# Patient Record
Sex: Female | Born: 1980 | Race: White | Hispanic: Yes | Marital: Married | Smoking: Never smoker
Health system: Southern US, Community
[De-identification: ages and names within clinical notes are randomized; demographics above are authoritative.]

## PROBLEM LIST (undated history)

## (undated) DIAGNOSIS — O139 Gestational [pregnancy-induced] hypertension without significant proteinuria, unspecified trimester: Secondary | ICD-10-CM

## (undated) DIAGNOSIS — R896 Abnormal cytological findings in specimens from other organs, systems and tissues: Secondary | ICD-10-CM

## (undated) DIAGNOSIS — Z975 Presence of (intrauterine) contraceptive device: Secondary | ICD-10-CM

## (undated) DIAGNOSIS — R87629 Unspecified abnormal cytological findings in specimens from vagina: Secondary | ICD-10-CM

## (undated) DIAGNOSIS — N83201 Unspecified ovarian cyst, right side: Secondary | ICD-10-CM

## (undated) DIAGNOSIS — F419 Anxiety disorder, unspecified: Secondary | ICD-10-CM

## (undated) DIAGNOSIS — IMO0001 Reserved for inherently not codable concepts without codable children: Secondary | ICD-10-CM

## (undated) DIAGNOSIS — D069 Carcinoma in situ of cervix, unspecified: Secondary | ICD-10-CM

## (undated) HISTORY — DX: Anxiety disorder, unspecified: F41.9

## (undated) HISTORY — DX: Reserved for inherently not codable concepts without codable children: IMO0001

## (undated) HISTORY — DX: Presence of (intrauterine) contraceptive device: Z97.5

## (undated) HISTORY — DX: Unspecified ovarian cyst, right side: N83.201

## (undated) HISTORY — DX: Abnormal cytological findings in specimens from other organs, systems and tissues: R89.6

## (undated) HISTORY — DX: Unspecified abnormal cytological findings in specimens from vagina: R87.629

## (undated) HISTORY — DX: Gestational (pregnancy-induced) hypertension without significant proteinuria, unspecified trimester: O13.9

## (undated) HISTORY — DX: Carcinoma in situ of cervix, unspecified: D06.9

---

## 2005-10-04 ENCOUNTER — Ambulatory Visit: Payer: Self-pay | Admitting: Family Medicine

## 2005-10-05 ENCOUNTER — Ambulatory Visit: Payer: Self-pay | Admitting: Family Medicine

## 2005-11-19 ENCOUNTER — Ambulatory Visit: Payer: Self-pay | Admitting: *Deleted

## 2005-11-19 ENCOUNTER — Ambulatory Visit: Payer: Self-pay | Admitting: Family Medicine

## 2005-12-27 ENCOUNTER — Ambulatory Visit: Payer: Self-pay | Admitting: Internal Medicine

## 2006-01-24 ENCOUNTER — Ambulatory Visit: Payer: Self-pay | Admitting: Family Medicine

## 2006-03-18 ENCOUNTER — Ambulatory Visit: Payer: Self-pay | Admitting: Family Medicine

## 2006-06-09 ENCOUNTER — Ambulatory Visit: Payer: Self-pay | Admitting: Internal Medicine

## 2006-08-15 ENCOUNTER — Ambulatory Visit: Payer: Self-pay | Admitting: Family Medicine

## 2006-09-02 ENCOUNTER — Ambulatory Visit: Payer: Self-pay | Admitting: Family Medicine

## 2006-11-24 ENCOUNTER — Ambulatory Visit: Payer: Self-pay | Admitting: Family Medicine

## 2007-01-26 ENCOUNTER — Ambulatory Visit: Payer: Self-pay | Admitting: Internal Medicine

## 2007-01-27 ENCOUNTER — Encounter: Payer: Self-pay | Admitting: Internal Medicine

## 2007-01-27 LAB — CONVERTED CEMR LAB
BUN: 7 mg/dL (ref 6–23)
CO2: 23 meq/L (ref 19–32)
Creatinine, Ser: 0.61 mg/dL (ref 0.40–1.20)
Glucose, Bld: 70 mg/dL (ref 70–99)
Helicobacter Pylori Antibody-IgG: 1 — ABNORMAL HIGH
Total Bilirubin: 0.4 mg/dL (ref 0.3–1.2)

## 2007-02-15 ENCOUNTER — Ambulatory Visit: Payer: Self-pay | Admitting: Internal Medicine

## 2007-03-08 ENCOUNTER — Encounter (INDEPENDENT_AMBULATORY_CARE_PROVIDER_SITE_OTHER): Payer: Self-pay | Admitting: *Deleted

## 2007-05-09 ENCOUNTER — Ambulatory Visit: Payer: Self-pay | Admitting: Internal Medicine

## 2007-06-08 ENCOUNTER — Ambulatory Visit: Payer: Self-pay | Admitting: Internal Medicine

## 2007-06-08 ENCOUNTER — Encounter: Payer: Self-pay | Admitting: Internal Medicine

## 2007-06-08 LAB — CONVERTED CEMR LAB
Chlamydia, DNA Probe: NEGATIVE
GC Probe Amp, Genital: NEGATIVE

## 2007-07-31 ENCOUNTER — Ambulatory Visit: Payer: Self-pay | Admitting: Family Medicine

## 2007-10-24 ENCOUNTER — Ambulatory Visit: Payer: Self-pay | Admitting: Internal Medicine

## 2007-11-14 ENCOUNTER — Ambulatory Visit: Payer: Self-pay | Admitting: Internal Medicine

## 2008-01-15 ENCOUNTER — Ambulatory Visit: Payer: Self-pay | Admitting: Internal Medicine

## 2008-09-23 ENCOUNTER — Ambulatory Visit: Payer: Self-pay | Admitting: Gynecology

## 2008-09-23 ENCOUNTER — Encounter: Payer: Self-pay | Admitting: Gynecology

## 2008-09-23 ENCOUNTER — Other Ambulatory Visit: Admission: RE | Admit: 2008-09-23 | Discharge: 2008-09-23 | Payer: Self-pay | Admitting: Gynecology

## 2008-10-07 ENCOUNTER — Ambulatory Visit: Payer: Self-pay | Admitting: Gynecology

## 2008-11-04 ENCOUNTER — Encounter: Payer: Self-pay | Admitting: Gynecology

## 2008-11-04 ENCOUNTER — Ambulatory Visit: Payer: Self-pay | Admitting: Gynecology

## 2009-02-23 ENCOUNTER — Emergency Department (HOSPITAL_COMMUNITY): Admission: EM | Admit: 2009-02-23 | Discharge: 2009-02-23 | Payer: Self-pay | Admitting: Emergency Medicine

## 2009-02-27 ENCOUNTER — Ambulatory Visit: Payer: Self-pay | Admitting: Gynecology

## 2009-05-02 ENCOUNTER — Other Ambulatory Visit: Admission: RE | Admit: 2009-05-02 | Discharge: 2009-05-02 | Payer: Self-pay | Admitting: Gynecology

## 2009-05-02 ENCOUNTER — Ambulatory Visit: Payer: Self-pay | Admitting: Gynecology

## 2009-05-02 ENCOUNTER — Encounter: Payer: Self-pay | Admitting: Gynecology

## 2009-09-26 ENCOUNTER — Ambulatory Visit: Payer: Self-pay | Admitting: Gynecology

## 2009-09-26 ENCOUNTER — Other Ambulatory Visit: Admission: RE | Admit: 2009-09-26 | Discharge: 2009-09-26 | Payer: Self-pay | Admitting: Gynecology

## 2009-11-27 ENCOUNTER — Ambulatory Visit: Payer: Self-pay | Admitting: Gynecology

## 2010-09-25 LAB — URINALYSIS, ROUTINE W REFLEX MICROSCOPIC
Hgb urine dipstick: NEGATIVE
Nitrite: NEGATIVE
Protein, ur: NEGATIVE mg/dL
Urobilinogen, UA: 0.2 mg/dL (ref 0.0–1.0)

## 2010-09-25 LAB — CBC
Hemoglobin: 13.7 g/dL (ref 12.0–15.0)
MCHC: 34 g/dL (ref 30.0–36.0)
MCV: 91.6 fL (ref 78.0–100.0)
RBC: 4.4 MIL/uL (ref 3.87–5.11)

## 2010-09-25 LAB — URINE MICROSCOPIC-ADD ON

## 2010-09-25 LAB — BASIC METABOLIC PANEL
CO2: 28 mEq/L (ref 19–32)
Chloride: 104 mEq/L (ref 96–112)
Creatinine, Ser: 0.43 mg/dL (ref 0.4–1.2)
GFR calc Af Amer: >60 mL/min (ref >60)

## 2010-09-25 LAB — DIFFERENTIAL
Eosinophils Absolute: 0 10*3/uL (ref 0.0–0.7)
Eosinophils Relative: 1 % (ref 0–5)
Lymphs Abs: 2.1 10*3/uL (ref 0.7–4.0)
Monocytes Relative: 5 % (ref 3–12)

## 2010-10-02 ENCOUNTER — Encounter (INDEPENDENT_AMBULATORY_CARE_PROVIDER_SITE_OTHER): Payer: 59 | Admitting: Gynecology

## 2010-10-02 ENCOUNTER — Other Ambulatory Visit: Payer: Self-pay | Admitting: Gynecology

## 2010-10-02 ENCOUNTER — Other Ambulatory Visit (HOSPITAL_COMMUNITY)
Admission: RE | Admit: 2010-10-02 | Discharge: 2010-10-02 | Disposition: A | Discharge status: Home / Self Care | Payer: 59 | Source: Ambulatory Visit | Attending: Gynecology | Admitting: Gynecology

## 2010-10-02 DIAGNOSIS — Z01419 Encounter for gynecological examination (general) (routine) without abnormal findings: Secondary | ICD-10-CM

## 2010-10-02 DIAGNOSIS — R82998 Other abnormal findings in urine: Secondary | ICD-10-CM

## 2010-10-02 DIAGNOSIS — Z124 Encounter for screening for malignant neoplasm of cervix: Secondary | ICD-10-CM | POA: Insufficient documentation

## 2010-10-02 DIAGNOSIS — N760 Acute vaginitis: Secondary | ICD-10-CM

## 2010-10-02 DIAGNOSIS — Z1159 Encounter for screening for other viral diseases: Secondary | ICD-10-CM | POA: Insufficient documentation

## 2010-10-02 DIAGNOSIS — Z13220 Encounter for screening for lipoid disorders: Secondary | ICD-10-CM

## 2010-10-02 DIAGNOSIS — Z3049 Encounter for surveillance of other contraceptives: Secondary | ICD-10-CM

## 2010-10-02 DIAGNOSIS — R635 Abnormal weight gain: Secondary | ICD-10-CM

## 2010-10-02 DIAGNOSIS — B373 Candidiasis of vulva and vagina: Secondary | ICD-10-CM

## 2010-10-02 DIAGNOSIS — N898 Other specified noninflammatory disorders of vagina: Secondary | ICD-10-CM

## 2010-10-02 DIAGNOSIS — Z833 Family history of diabetes mellitus: Secondary | ICD-10-CM

## 2011-01-08 ENCOUNTER — Ambulatory Visit: Payer: 59 | Admitting: Gynecology

## 2011-01-08 ENCOUNTER — Ambulatory Visit (INDEPENDENT_AMBULATORY_CARE_PROVIDER_SITE_OTHER): Payer: 59 | Admitting: Gynecology

## 2011-01-08 DIAGNOSIS — R51 Headache: Secondary | ICD-10-CM

## 2011-01-08 DIAGNOSIS — R635 Abnormal weight gain: Secondary | ICD-10-CM

## 2011-01-08 DIAGNOSIS — N949 Unspecified condition associated with female genital organs and menstrual cycle: Secondary | ICD-10-CM

## 2011-01-08 DIAGNOSIS — N915 Oligomenorrhea, unspecified: Secondary | ICD-10-CM

## 2011-01-19 ENCOUNTER — Encounter: Payer: Self-pay | Admitting: Gynecology

## 2011-01-19 ENCOUNTER — Ambulatory Visit (INDEPENDENT_AMBULATORY_CARE_PROVIDER_SITE_OTHER): Payer: 59 | Admitting: Gynecology

## 2011-01-19 VITALS — BP 128/84

## 2011-01-19 DIAGNOSIS — Z30431 Encounter for routine checking of intrauterine contraceptive device: Secondary | ICD-10-CM

## 2011-01-19 DIAGNOSIS — Z975 Presence of (intrauterine) contraceptive device: Secondary | ICD-10-CM

## 2011-01-19 HISTORY — DX: Presence of (intrauterine) contraceptive device: Z97.5

## 2011-01-19 MED ORDER — MEDROXYPROGESTERONE ACETATE 10 MG PO TABS: 10.0000 mg | ORAL_TABLET | Freq: Every day | ORAL | Status: DC

## 2011-01-19 NOTE — Progress Notes (Signed)
Patient is a 30 year old gravida 1 para 1 who seen in the office on July 20 she complaining of headaches. She had a Mirena IUD placed in 2010 it had occasional spotting between her periods. She has been under a lot of tension are working for her headaches may be attributed to that or a combination of the progesterone component of the Mirena IUD. Should be given Xanax 0.25 mg to take daily on a when necessary basis. And also she had been given prescription for treximet which helped her symptoms. She presented to the office today to remove her Mirena IUD and to replace it with a nonhormonal IUD such as the ParaGard T380A IUD.  Pelvic exam: Bartholin urethra Skene glands: Within normal limits Vagina: No gross lesions on inspection Cervix: IUD string visualized Bimanual exam: Uterus anteverted upper limits of normal no palpable masses or tenderness Adnexa: No palpable masses or tenderness  The patient was counseled as to the risks benefits and pros and cons of the ParaGard IUD she fully accepts and understands information was provided in [place]. The cervix was cleansed with Betadine solution the Mirena IUD was removed shown to the patient and discarded. A single-tooth tenaculum was then placed on the anterior cervical lip and the uterus was sounded to proximally 7 cm. The ParaGard T380A IUD was placed in a sterile fashion. Patient is fully aware that this for contraception is 99% effective and is good for 10 years. She'll return back in one month for followup as well as she needs a followup Pap smear due to the fact that time the pharyngeal exam on April of 2012 her Pap smear had demonstrated atypical squamous cells of undetermined significance acute inflammation and high-risk HPV was not detected. All questions were answered.

## 2011-01-19 NOTE — Patient Instructions (Signed)
Tomar provera 10 mg por 5-10 dias al mes si no te comienza el periodo espontanio.

## 2011-01-27 ENCOUNTER — Ambulatory Visit: Payer: 59 | Admitting: Gynecology

## 2011-02-19 ENCOUNTER — Ambulatory Visit (INDEPENDENT_AMBULATORY_CARE_PROVIDER_SITE_OTHER): Payer: 59 | Admitting: Gynecology

## 2011-02-19 ENCOUNTER — Ambulatory Visit: Payer: 59 | Admitting: Gynecology

## 2011-02-19 DIAGNOSIS — B373 Candidiasis of vulva and vagina: Secondary | ICD-10-CM

## 2011-02-19 DIAGNOSIS — Z30431 Encounter for routine checking of intrauterine contraceptive device: Secondary | ICD-10-CM

## 2011-02-19 DIAGNOSIS — N898 Other specified noninflammatory disorders of vagina: Secondary | ICD-10-CM

## 2011-02-19 DIAGNOSIS — Z113 Encounter for screening for infections with a predominantly sexual mode of transmission: Secondary | ICD-10-CM

## 2011-02-19 DIAGNOSIS — IMO0001 Reserved for inherently not codable concepts without codable children: Secondary | ICD-10-CM | POA: Insufficient documentation

## 2011-02-19 MED ORDER — FLUCONAZOLE 150 MG PO TABS: 150.0000 mg | ORAL_TABLET | Freq: Once | ORAL | Status: AC

## 2011-02-19 NOTE — Progress Notes (Signed)
A 30 year old gravida 1 para 1 who presented to the office today after one month of placement of a ParaGard T380A IUD. She was requesting the IUD string to be trimmed. She was interested in a full STD screen.  Pelvic exam: Bartholin urethra Skene glands: Within normal limits Vagina: No vaginal discharge or lesions Cervix: IUD string visualized no lesion discharge Uterus: Anteverted normal size shape and consistency Adnexa: No palpable masses or tenderness Rectal exam: Not done  GC and Chlamydia culture along with a wet prep was done today. Patient will stop by the lab to complete the STD screen she will have an HIV, hepatitis B surface antigen, and RPR. Will notify her next week and is any abnormality of a these tests otherwise will see her back in one year or when necessary. Of note the wet prep demonstrated moniliasis and she was given a prescription of Diflucan 150 mg to take one by mouth.

## 2011-06-18 ENCOUNTER — Ambulatory Visit (INDEPENDENT_AMBULATORY_CARE_PROVIDER_SITE_OTHER): Payer: 59 | Admitting: Women's Health

## 2011-06-18 ENCOUNTER — Encounter: Payer: Self-pay | Admitting: Women's Health

## 2011-06-18 DIAGNOSIS — R1031 Right lower quadrant pain: Secondary | ICD-10-CM

## 2011-06-18 DIAGNOSIS — R82998 Other abnormal findings in urine: Secondary | ICD-10-CM

## 2011-06-18 DIAGNOSIS — N898 Other specified noninflammatory disorders of vagina: Secondary | ICD-10-CM

## 2011-06-18 DIAGNOSIS — R1032 Left lower quadrant pain: Secondary | ICD-10-CM

## 2011-06-18 DIAGNOSIS — R109 Unspecified abdominal pain: Secondary | ICD-10-CM

## 2011-06-18 MED ORDER — METRONIDAZOLE 0.75 % VA GEL: VAGINAL | Status: AC

## 2011-06-18 MED ORDER — FLUCONAZOLE 150 MG PO TABS: 150.0000 mg | ORAL_TABLET | Freq: Once | ORAL | Status: AC

## 2011-06-18 NOTE — Progress Notes (Signed)
Patient ID: Stacy Romero, female   DOB: 02-03-1981, 30 y.o.   MRN: [id number] Presents with a complaint of vaginal discharge, slight abdominal pain, 2 cycles in November, LMP December 15. Rare cycles with a Mirena IUD placed in 2010. Denies urinary symptoms, fever. Same partner, questions fidelity. Negative STD screen August of 2012.  Exam: No CVAT, abdomen soft, nontender, no rebound or radiation of pain. External genitalia is within normal limits, speculum exam scant white discharge, IUD string visible at os. Wet prep positive for yeast amines, clue cells and +3 bacteria. GC Chlamydia culture pending bimanual no CMT or adnexal fullness or tenderness. UA 5-10 WBCs and +1 bacteria.  BV and yeast  Plan: MetroGel vaginal cream 1 applicator at bedtime x5, and Diflucan 150 by mouth x1 dose with refill. If GC/ Chlamydia culture positive will return to the office for HIV hepatitis and RPR. Instructed to keep a menstrual record, if cycles closer than every 21 days instructed to return to office. Condoms encouraged until permanent partner. Urine culture pending.

## 2011-06-21 LAB — GC/CHLAMYDIA PROBE AMP, GENITAL
Chlamydia, DNA Probe: NEGATIVE
GC Probe Amp, Genital: NEGATIVE

## 2011-07-28 ENCOUNTER — Encounter: Payer: Self-pay | Admitting: Gynecology

## 2011-07-28 ENCOUNTER — Ambulatory Visit (INDEPENDENT_AMBULATORY_CARE_PROVIDER_SITE_OTHER): Payer: 59 | Admitting: Gynecology

## 2011-07-28 VITALS — BP 130/70

## 2011-07-28 DIAGNOSIS — R635 Abnormal weight gain: Secondary | ICD-10-CM | POA: Insufficient documentation

## 2011-07-28 DIAGNOSIS — R5383 Other fatigue: Secondary | ICD-10-CM

## 2011-07-28 DIAGNOSIS — H538 Other visual disturbances: Secondary | ICD-10-CM

## 2011-07-28 DIAGNOSIS — R51 Headache: Secondary | ICD-10-CM

## 2011-07-28 LAB — CBC WITH DIFFERENTIAL/PLATELET
Basophils Relative: 0 % (ref 0–1)
Hemoglobin: 12.5 g/dL (ref 12.0–15.0)
Lymphs Abs: 2.6 10*3/uL (ref 0.7–4.0)
MCHC: 33.2 g/dL (ref 30.0–36.0)
Monocytes Relative: 5 % (ref 3–12)
Neutro Abs: 4.9 10*3/uL (ref 1.7–7.7)
Neutrophils Relative %: 61 % (ref 43–77)
Platelets: 273 10*3/uL (ref 150–400)
RBC: 4.19 MIL/uL (ref 3.87–5.11)

## 2011-07-28 LAB — COMPREHENSIVE METABOLIC PANEL
ALT: 10 U/L (ref 0–35)
AST: 14 U/L (ref 0–37)
CO2: 27 mEq/L (ref 19–32)
Sodium: 140 mEq/L (ref 135–145)
Total Bilirubin: 0.2 mg/dL — ABNORMAL LOW (ref 0.3–1.2)
Total Protein: 7.2 g/dL (ref 6.0–8.3)

## 2011-07-28 MED ORDER — SUMATRIPTAN-NAPROXEN SODIUM 85-500 MG PO TABS: 1.0000 | ORAL_TABLET | ORAL | Status: DC | PRN

## 2011-07-28 MED ORDER — ALPRAZOLAM 0.5 MG PO TABS: 0.5000 mg | ORAL_TABLET | Freq: Every evening | ORAL | Status: DC | PRN

## 2011-07-28 NOTE — Progress Notes (Signed)
Patient is a 31 year old who presented to the office today for almost a week and a half of constant headache. That has been worse. She stated she's had this headache since last year. She had a Mirena IUD and a few months ago we switched it to a ParaGard T380A IUD (nonhormonal) to see if this would help with her headaches and she states that she has not noticed any difference. Her headaches are described being temporal in nature the started the beginning of the day and migrate to the back of her head and neck. She states that she's in front of a computer for 12 hours a day and sometimes worse as much as 60 hours per week.  She had seen an ophthalmologist and he recommended contact lenses to help with some of the glare from the computer screen but patient used to for 3 days and could not tolerate the contact lenses. She states that when she is at home and at work her headache decreased in frequency and intensity. She states at times she does have double vision but denies any nipple discharge. Her cycles are otherwise reported to be regular.  She's been complaining of dryness fatigue and weight gain. Review of her record indicated in August 2012 she had an HIV, RPR, hepatitis B. screen which were all normal.  Exam: HEENT: Unremarkable Lungs: Clear to auscultation Rogers or wheezes Heart: Regular rate and rhythm no murmurs or gallops  Patient was given a sample and a prescription for sumatriptan to take 1 tablet when her headache starts in may repeat in 2 hours if needed but not to exceed 2 tablets in a 24-hour period. She will stop by the lab where going to check her TSH, prolactin, comprehensive metabolic panel and CBC. We'll also schedule for her to have an MRI and then to followup with her neurologist. She was asked to keep a diary of her headaches to see if she can identify any foods that may be triggering at as well. Literature information on headaches in Spanish was provided.

## 2011-07-28 NOTE — Patient Instructions (Signed)
Te vamos a llamar esta semana para las cita: MRI y cita con neurologo.   Dolor de Training and development officer, preguntas frecuentes y sus respuestas (Headaches, Frequently Asked Questions) CEFALEAS MIGRAOSAS P: Qu es la migraa? Qu la ocasiona? Cmo puedo tratarla? R: En general, la migraa comienza como un dolor apagado. Luego progresa hacia un dolor, constante, punzante y como un latido. Sentir Scientist, physiological las sienes. Podr sentir Radiographer, therapeutic parte anterior o posterior de la cabeza, o en uno o ambos lados. El dolor suele estar acompaado de una combinacin de:  Nuseas.   Vmitos.   Sensibilidad a la luz y los ruidos.  Algunas personas (un 15%) experimentan un aura (ver abajo) antes de un ataque. La causa de la migraa se debe a reacciones qumicas del cerebro. El tratamiento para la migraa puede incluir medicamentos de [place]. Tambin puede incluir tcnicas de [place]. Estas incluyen entrenamientos para la relajacin y biorretroalimentacin.  P: Qu es un aura? R: Alrededor del 15% de las personas con migraa tiene un "aura". Es una seal de sntomas neurolgicos que ocurren antes de un dolor de cabeza por migraas. Podr ver lneas onduladas o irregulares, puntos o luces parpadeantes. Podr experimentar visin de tnel o puntos ciegos en uno o ambos ojos. El aura puede incluir alucinaciones visuales o auditivas (algo que se imagina). Puede incluir trastornos en el olfato (como olores extraos), el tacto o el gusto. Entre otros sntomas se incluyen:  Adormecimiento.   Sensacin de hormigueo.   Dificultad para recordar o Occupational hygienist.  Estos episodios neurolgicos pueden durar hasta 60 minutos. Los sntomas desaparecern a medida que el dolor de cabeza comience. P:Qu es un disparador? R: Ciertos factores fsicos o Sports administrator a "disparar" una migraa. Estos son:  Alimentos.   Cambios hormonales.   Clima.   Estrs.  Es importante recordar que los  disparadores son diferentes entre si. Para ayudar a prevenir ataques de migraas, necesitar descubrir cules son los Psychologist, prison and probation services. Lleve un diario sobre sus dolores de [place]. Este es un buen modo para descubrir los disparadores. El Sales executive en el momento de hablar con el profesional acerca de su enfermedad. P: El clima afecta en las migraas? R: La luz solar, el calor, la humedad y lo cambios drsticos en la presin Software engineer a, o "disparar" un ataque de migraa en [employer]. Pero estudios han demostrado que el clima no acta como disparador para todas las personas con [place]. P: Cul es la relacin entre la migraa y la hormonas? R: Las hormonas inician y regulan muchas de las funciones corporales. Las hormonas [employer] balance en el cuerpo dentro de los constantes cambios de [place]. Algunas veces, el nivel de hormonas en el cuerpo se desbalancea. Por ejemplo, durante la menstruacin, el embarazo o la [place]. Pueden ser la causa de un ataque de migraa. De hecho, alrededor de tres cuartos de las mujeres con migraa informan que sus ataques estn relacionados con el ciclo menstrual.  P: Aumenta el riesgo de sufrir un choque cardaco en las personas que padecen migraa? R: La probabilidad de que un ataque de migraa ocasione un ataque cardaco es muy remota. Esto no quiere [employer] persona que sufre de migraa no pueda tener un ataque cardaco asociado con ella. En las personas menores de 40 aos, el factor ms comn para un ataque es la [place]. Pero durante la vida de una persona, la ocurrencia de un dolor de cabeza por  migraa est asociada con una reduccin en el riesgo de morir por un ataque cerebrovascular.  P: Cules son los medicamentos para la migraa? R: La medicacin precisa se [place] para tratar el dolor de cabeza una vez que ha comenzado. Son ejemplos, medicamentos de [place], desinflamatorios sin esteroides, ergotamnicos  y triptanos.  P: Qu son los triptanos? R: Lo triptanos son Yaakov Guthrie clase de medicamentos abortivos. Son especficos para tratar este problema. Los triptanos son vasoconstrictores. Moderan algunas reacciones qumicas del cerebro. Los triptanos trabajan como receptores del cerebro. Ayudan a Warehouse manager de un neurotransmisor denominado serotonina. Se cree que las fluctuaciones en los [place] de serotonina son la causa principal de la migraa.  P: Son [employer] de venta libre para la migraa? R: Los medicamentos de [employer] pueden ser efectivos para Paramedic dolores leves a moderados y los sntomas asociados a la [place]. Pero deber consultar a un mdico antes de Charity fundraiser tratamiento para la migraa.  P: Cules son los medicamentos de prevencin de la migraa? R: Se suele denominar tratamiento "profilctico" a los medicamentos para la prevencin de la migraa. Se utilizan para reducir Print production planner, gravedad y duracin de los ataques de [place]. Son ejemplos de medicamentos de prevencin: antiepilpticos, antidepresivos, bloqueadores beta, bloqueadores de los canales de calcio y medicamentos antiinflamatorios sin esteroides. P:  Por qu se utilizan anticonvulsivantes para tratar la migraa? R: Durante los ltimos aos, ha habido un creciente inters en las drogas antiepilpticas para la prevencin de la migraa. A menudo se los conoce como "anticonvulsivantes". La epilepsia y la migraa suceden por reacciones similares en el cerebro.  P:  Por qu se utilizan antidepresivos para tratar la migraa? R: Los antidepresivos tpicamente se [place] para tratar a las personas con depresin. Pueden reducir la frecuencia de la migraa a travs de la regulacin de los niveles qumicos, como la serotonina, en el cerebro.  P:  Por qu se utilizan terapias alternativas para tratar la migraa? R: El trmino "terapias alternativas" suelen utilizarse para describir los  tratamientos que se considera que estn por fuera de Occupational hygienist la medicina occidental convencional. Son ejemplos de las terapias alternativas: la acupuntura, la acupresin y el yoga. Otra terapia alternativa comn es la terapia herbal. Se cree que algunas hierbas ayudan a [employer] de [place]. Siempre consulte con [employer] acerca de las terapias alternativas antes de [place]. Algunos productos herbales contienen arsnico y [place] toxinas. DOLORES DE CABEZA POR TENSIN P: Qu es un dolor de cabeza por tensin? Qu lo ocasiona? Cmo puedo tratarlo? R: Los dolores de cabeza por tensin ocurren al azar. A menudo son el resultado de estrs temporario, ansiedad, fatiga o ira. Los sntomas Environmental education officer en las sienes, una sensacin como de tener una banda alrededor de la cabeza (un dolor que "presiona"). Los sntomas pueden incluir una sensacin de [place], de presin y Engineer, materials de los msculos de la cabeza y el cuello. El dolor comienza en la frente, sienes o en la parte posterior de la cabeza y el cuello. El tratamiento para los dolores de cabeza por tensin puede incluir medicamentos de [place]. Tambin puede incluir tcnicas de [place] con entrenamientos para la relajacin y biorretroalimentacin. CEFALEA EN RACIMOS P: Qu es una cefalea en racimos? Qu la ocasiona? Cmo puedo tratarla? R: La cefalea en racimos toma su nombre debido a que los ataques vienen en grupos. El dolor aparece con poco o ningn aviso. Normalmente ocurre de un lado de la cabeza. Muchas veces el  dolor viene acompaado de un lagrimeo u ojo rojo y goteo de la nariz del mismo lado que Chief Technology Officer. Se cree que la causa es una reaccin en las sustancias qumicas del cerebro. Se describe como el caso ms grave e intenso de cualquier tipo de dolor de cabeza. El tratamiento incluye medicamentos bajo receta y oxgeno. CEFALEA SINUSAL P: Qu es una cefalea sinusal? Qu la ocasiona? Cmo puedo tratarla? R: Cuando se  inflama una cavidad en los huesos de la cara y el crneo (sinus) ocasiona un dolor localizado. Esta enfermedad generalmente es el resultado de una reaccin alrgica, un tumor o una infeccin. Si el dolor de cabeza est ocasionado por un bloqueo del sinus, como una infeccin, probablemente tendr [place]. Una imagen de rayos X confirmar el bloqueo del sinus. El tratamiento indicado por el mdico podr incluir antibiticos para la infeccin, y tambin antihistamnicos o descongestivos.  DOLOR DE CABEZA POR EFECTO "REBOTE" P: Qu es un dolor de cabeza por efecto "rebote"? Qu lo ocasiona? Cmo puedo tratarlo? R: Si se toman medicamentos para el dolor de cabeza muy a menudo puede llevar a la enfermedad conocida como "dolor de cabeza por rebote". Un patrn de abuso de medicamentos para el dolor de cabeza supone tomarlos ms de [employer] por semana o en cantidades excesivas. Esto significa ms que lo que indica el envase o el mdico. Con los dolores de cabeza por rebote, los medicamentos no slo dejan de Engineer, materials sino que adems comienzan a Data processing manager dolores de [place]. Los mdicos tratan los dolores de cabeza por rebote mediante la disminucin del medicamento del que se ha abusado. A veces el medico podr sustituir gradualmente por un tipo diferente de tratamiento o medicacin. Dejar de consumirlo podra ser difcil. El abuso regular de un medicamento aumenta el potencial que se produzcan efectos secundarios graves. Consulte con un mdico si utiliza regularmente medicamentos para Chief Technology Officer de cabeza ms de [address] por semana o ms de lo que indica el envase. PREGUNTAS Y RESPUESTAS ADICIONALES P: Qu es la biorretroalimentacin? R: La biorretroalimentacin es un tratamiento de [place]. La biorretroalimentacin utiliza un equipamiento especial para controlar los movimientos involuntarios del cuerpo y las respuestas fsicas. La biorretroalimentacin controla:  Respiracin.   [place].   Latidos  cardacos.   Temperatura.   Tensin muscular.   Actividad cerebrales.  La biorretroalimentacin le ayudar a mejorar y Production manager sus ejercicios de Microbiologist. Aprender a Chief Operating Officer las respuestas fsicas relacionadas con el estrs. Una vez que se dominan las tcnicas no necesitar ms el equipamiento. P: Son hereditarios los dolores de [place]? R: Segn algunas estimaciones, aproximadamente 28 millones de estadounidenses sufren migraa. Cuatro de cada cinco (80%) informan una historia familiar de migraa. Los investigadores no pueden asegurar si se trata de un problema gentico o Patent examiner. A pesar de esto, un nio tiene 50% de probabilidades de sufrir migraa si uno de sus padres la sufre. El nio tiene un 75% de probabilidades si ambos padres la sufren.  P. Puede un nio tener migraa? R: En el momento de ingresar a la escuela secundaria, la mayora de los jvenes han experimentado algn tipo de cefalea. Algunos abordajes o medicamentos seguros y Sports administrator las cefaleas o detenerlas luego de que han comenzado.  Olin Hauser tipo de especialista debe ver para diagnosticar y tratar una cefalea? R: Comience con su mdico de cabecera. Huel [place] de su experiencia y abordaje de las cefaleas. Comente los mtodos de clasificacin, diagnstico y [place]. El profesional  decidir si lo derivar a un especialista, segn los sntomas u otras enfermedades. El hecho de sufrir diabetes, Environmental consultant, Catering manager, puede requerir un abordaje ms complejo. La National Headache Foundation (Fundacin Nacional para las [place]) Chief Technology Officer, a pedido, Physiological scientist lista de los mdicos que son miembros de [place]. Document Released: 05/20/2008 Document Revised: 02/17/2011 [facility] Patient Information 2012 [place], [place].

## 2011-07-29 ENCOUNTER — Other Ambulatory Visit: Payer: Self-pay | Admitting: *Deleted

## 2011-07-29 ENCOUNTER — Telehealth: Payer: Self-pay | Admitting: *Deleted

## 2011-07-29 DIAGNOSIS — R51 Headache: Secondary | ICD-10-CM

## 2011-07-29 LAB — TSH: TSH: 0.708 u[IU]/mL (ref 0.350–4.500)

## 2011-07-29 LAB — PROLACTIN: Prolactin: 7.1 ng/mL

## 2011-07-29 MED ORDER — SUMATRIPTAN SUCCINATE 100 MG PO TABS: 100.0000 mg | ORAL_TABLET | ORAL | Status: DC | PRN

## 2011-07-29 NOTE — Telephone Encounter (Signed)
[facility] CMA informed patient that appt set up with Dr. Clarisse Gouge on 08/11/11 @10 :15am.  Records have been faxed.

## 2011-07-29 NOTE — Telephone Encounter (Signed)
rx sent in. Patient will be informed.

## 2011-07-29 NOTE — Telephone Encounter (Signed)
Imitrex generic 100 mg tablet when necessary headache may be prescribed. She may repeat a second dose in 2 hours but not to exceed 2 tablets in 24 hours. Prescribe a total of 10 with 1 refill.

## 2011-07-29 NOTE — Telephone Encounter (Signed)
Message copied by Libby Maw on Thu Jul 29, 2011  3:35 PM ------      Message from: Ok Edwards      Created: Thu Jul 29, 2011  1:47 PM       Anastasiya Gowin, yes this will be fine. They can order the MRI after their  evaluation. Thank yo      ----- Message -----         From: Otto Herb, CNA         Sent: 07/29/2011   9:46 AM           To: Ok Edwards, MD            Dr. Glenetta Hew,       I got patient in with Dr. Clarisse Gouge on 08/11/11 @10 :15am.  They said they would see patient and after evaluation would order MRI.  Ok?      ----- Message -----         From: Ok Edwards, MD         Sent: 07/28/2011   5:38 PM           To: Ved Martos Price, CNA            Loys Hoselton, I have put a request for an MRI of the brain which will need to be scheduled. I need for you to schedule an appointment for her to see the neurologist Dr. Vela Prose any time after the study. If he have any questions please let me know thank you

## 2011-07-29 NOTE — Telephone Encounter (Signed)
Pharmacy sent fax.  Treximet not covered by insurance.  Will need to change med.  Can we do generic Imitrex?

## 2011-08-02 ENCOUNTER — Other Ambulatory Visit: Payer: Self-pay | Admitting: *Deleted

## 2011-08-02 NOTE — Telephone Encounter (Signed)
Called in refill on Xanax that was printed for 07/28/11.

## 2011-10-11 ENCOUNTER — Other Ambulatory Visit: Payer: Self-pay | Admitting: *Deleted

## 2011-10-11 MED ORDER — ALPRAZOLAM 0.5 MG PO TABS: 0.5000 mg | ORAL_TABLET | Freq: Every evening | ORAL | Status: DC | PRN

## 2011-11-24 ENCOUNTER — Telehealth: Payer: Self-pay | Admitting: *Deleted

## 2011-11-24 MED ORDER — ALPRAZOLAM 0.5 MG PO TABS: 0.5000 mg | ORAL_TABLET | Freq: Every evening | ORAL | Status: DC | PRN

## 2011-11-24 NOTE — Telephone Encounter (Signed)
Pt is calling requesting refill on xanax 0.5 mg tablets last filled in April 2013. Okay to fill?

## 2011-11-24 NOTE — Telephone Encounter (Signed)
One refill for Xanax 0.5 mg to take 1 by mouth daily when necessary only. 30 tablets no refills. Patient overdue for annual exam.

## 2011-11-24 NOTE — Telephone Encounter (Signed)
rx called in, left on voicemail to make annual exam OV

## 2011-12-20 DIAGNOSIS — D069 Carcinoma in situ of cervix, unspecified: Secondary | ICD-10-CM

## 2011-12-20 HISTORY — DX: Carcinoma in situ of cervix, unspecified: D06.9

## 2011-12-21 ENCOUNTER — Encounter: Payer: 59 | Admitting: Gynecology

## 2011-12-28 ENCOUNTER — Other Ambulatory Visit (HOSPITAL_COMMUNITY)
Admission: RE | Admit: 2011-12-28 | Discharge: 2011-12-28 | Disposition: A | Discharge status: Home / Self Care | Payer: 59 | Source: Ambulatory Visit | Attending: Gynecology | Admitting: Gynecology

## 2011-12-28 ENCOUNTER — Ambulatory Visit (INDEPENDENT_AMBULATORY_CARE_PROVIDER_SITE_OTHER): Payer: 59 | Admitting: Gynecology

## 2011-12-28 ENCOUNTER — Encounter: Payer: Self-pay | Admitting: Gynecology

## 2011-12-28 VITALS — BP 120/78 | Ht 60.75 in | Wt 129.0 lb

## 2011-12-28 DIAGNOSIS — F411 Generalized anxiety disorder: Secondary | ICD-10-CM

## 2011-12-28 DIAGNOSIS — Z113 Encounter for screening for infections with a predominantly sexual mode of transmission: Secondary | ICD-10-CM

## 2011-12-28 DIAGNOSIS — Z833 Family history of diabetes mellitus: Secondary | ICD-10-CM

## 2011-12-28 DIAGNOSIS — Z975 Presence of (intrauterine) contraceptive device: Secondary | ICD-10-CM

## 2011-12-28 DIAGNOSIS — Z01419 Encounter for gynecological examination (general) (routine) without abnormal findings: Secondary | ICD-10-CM | POA: Insufficient documentation

## 2011-12-28 DIAGNOSIS — G4700 Insomnia, unspecified: Secondary | ICD-10-CM

## 2011-12-28 DIAGNOSIS — N898 Other specified noninflammatory disorders of vagina: Secondary | ICD-10-CM

## 2011-12-28 DIAGNOSIS — R87810 Cervical high risk human papillomavirus (HPV) DNA test positive: Secondary | ICD-10-CM | POA: Insufficient documentation

## 2011-12-28 DIAGNOSIS — F419 Anxiety disorder, unspecified: Secondary | ICD-10-CM

## 2011-12-28 LAB — CBC WITH DIFFERENTIAL/PLATELET
Basophils Absolute: 0 10*3/uL (ref 0.0–0.1)
Lymphocytes Relative: 27 % (ref 12–46)
Lymphs Abs: 2.2 10*3/uL (ref 0.7–4.0)
MCV: 88.5 fL (ref 78.0–100.0)
Neutro Abs: 5.3 10*3/uL (ref 1.7–7.7)
Neutrophils Relative %: 66 % (ref 43–77)
Platelets: 276 10*3/uL (ref 150–400)
RBC: 4.45 MIL/uL (ref 3.87–5.11)
RDW: 13.4 % (ref 11.5–15.5)
WBC: 8.1 10*3/uL (ref 4.0–10.5)

## 2011-12-28 LAB — TSH: TSH: 0.652 u[IU]/mL (ref 0.350–4.500)

## 2011-12-28 LAB — CHOLESTEROL, TOTAL: Cholesterol: 141 mg/dL (ref 0–200)

## 2011-12-28 LAB — WET PREP FOR TRICH, YEAST, CLUE

## 2011-12-28 LAB — GLUCOSE, RANDOM: Glucose, Bld: 76 mg/dL (ref 70–99)

## 2011-12-28 MED ORDER — METRONIDAZOLE 500 MG PO TABS: 500.0000 mg | ORAL_TABLET | Freq: Two times a day (BID) | ORAL | Status: AC

## 2011-12-28 MED ORDER — ALPRAZOLAM 0.5 MG PO TABS: 0.5000 mg | ORAL_TABLET | Freq: Every evening | ORAL | Status: DC | PRN

## 2011-12-28 NOTE — Progress Notes (Addendum)
Stacy Romero 21-May-1981 [id number]   History:    31 y.o.  for annual gyn exam who stated that she has like discharge with odor before her menses and after menses. She had a Mirena IUD from 2010 to July of 2012 and we switched it because she said that she had spotting at different times and did no what her cycles was going to start. She was changed to a ParaGard T380A IUD. She would like to return back to the Mirena IUD. She states her cycles last 3-4 days. She has a new sexual partner for the past 4 months. She has had work shift changes and has suffered from insomnia as well as from anxiety. She had been prescribed in the past and in which she has taken on a when necessary basis. Review of her record indicated that in 2012 her Pap smear had demonstrated atypical squamous cells of undetermined significance but no high-risk HPV was noted and she had a negative colposcopy and biopsy. Review of her record indicated she was weighing 131 down to 129 and she in frequently does her self breast examination. She does have a very strong family history of diabetes.  Past medical history,surgical history, family history and social history were all reviewed and documented in the EPIC chart.  Gynecologic History Patient's last menstrual period was 12/10/2011. Contraception: IUD Last Pap: 2012. Results were: Ascus negative for high-risk HPV Last mammogram: Not indicated. Results were: Not indicated  Obstetric History OB History    Grav Para Term Preterm Abortions TAB SAB Ect Mult Living   1 1 1       1      # Outc Date GA Lbr Len/2nd Wgt Sex Del Anes PTL Lv   1 TRM     F SVD  No Yes       ROS: A ROS was performed and pertinent positives and negatives are included in the history.  GENERAL: No fevers or chills. HEENT: No change in vision, no earache, sore throat or sinus congestion. NECK: No pain or stiffness. CARDIOVASCULAR: No chest pain or pressure. No palpitations. PULMONARY: No shortness of breath,  cough or wheeze. GASTROINTESTINAL: No abdominal pain, nausea, vomiting or diarrhea, melena or bright red blood per rectum. GENITOURINARY: No urinary frequency, urgency, hesitancy or dysuria. MUSCULOSKELETAL: No joint or muscle pain, no back pain, no recent trauma. DERMATOLOGIC: No rash, no itching, no lesions. ENDOCRINE: No polyuria, polydipsia, no heat or cold intolerance. No recent change in weight. HEMATOLOGICAL: No anemia or easy bruising or bleeding. NEUROLOGIC: No headache, seizures, numbness, tingling or weakness. PSYCHIATRIC: No depression, no loss of interest in normal activity or change in sleep pattern.     Exam: chaperone present  BP 120/78  Ht 5' 0.75" (1.543 m)  Wt 129 lb (58.514 kg)  BMI 24.58 kg/m2  LMP 12/10/2011  Body mass index is 24.58 kg/(m^2).  General appearance : Well developed well nourished female. No acute distress HEENT: Neck supple, trachea midline, no carotid bruits, no thyroidmegaly Lungs: Clear to auscultation, no rhonchi or wheezes, or rib retractions  Heart: Regular rate and rhythm, no murmurs or gallops Breast:Examined in sitting and supine position were symmetrical in appearance, no palpable masses or tenderness,  no skin retraction, no nipple inversion, no nipple discharge, no skin discoloration, no axillary or supraclavicular lymphadenopathy Abdomen: no palpable masses or tenderness, no rebound or guarding Extremities: no edema or skin discoloration or tenderness  Pelvic:  Bartholin, Urethra, Skene Glands: Within normal limits  Vagina: No gross lesions or discharge, slight white discharge  Cervix: No gross lesions or discharge, IUD string seen  Uterus  anteverted, normal size, shape and consistency, non-tender and mobile  Adnexa  Without masses or tenderness  Anus and perineum  normal   Rectovaginal  normal sphincter tone without palpated masses or tenderness             Hemoccult not done   Wet prep: Moderate amount of close else, too  numerous to count bacteria, few white blood cells, positiveAmine  Assessment/Plan:  31 y.o. female for annual exam will be treated for bacterial vaginosis with Flagyl 500 mg twice a day for 5 days. I've instructed her to use the refresh a probiotic gel twice a week. Will check to see if her insurance will cover to switch her back to the Mirena IUD. GC and Chlamydia culture obtained results pending at time of this dictation. The following labs were obtained today: Pap smear, CBC, fasting blood sugar, fasting lipid profile, and urinalysis. She was encouraged to do her monthly self breast examinations.    Ok Edwards MD, 12:13 PM 12/28/2011

## 2011-12-28 NOTE — Patient Instructions (Addendum)
Health Maintenance, Females A healthy lifestyle and preventative care can promote health and wellness.  Maintain regular health, dental, and eye exams.   Eat a healthy diet. Foods like vegetables, fruits, whole grains, low-fat dairy products, and lean protein foods contain the nutrients you need without too many calories. Decrease your intake of foods high in solid fats, added sugars, and salt. Get information about a proper diet from your caregiver, if necessary.   Regular physical exercise is one of the most important things you can do for your health. Most adults should get at least 150 minutes of moderate-intensity exercise (any activity that increases your heart rate and causes you to sweat) each week. In addition, most adults need muscle-strengthening exercises on 2 or more days a week.    Maintain a healthy weight. The body mass index (BMI) is a screening tool to identify possible weight problems. It provides an estimate of body fat based on height and weight. Your caregiver can help determine your BMI, and can help you achieve or maintain a healthy weight. For adults 20 years and older:   A BMI below 18.5 is considered underweight.   A BMI of 18.5 to 24.9 is normal.   A BMI of 25 to 29.9 is considered overweight.   A BMI of 30 and above is considered obese.   Maintain normal blood lipids and cholesterol by exercising and minimizing your intake of saturated fat. Eat a balanced diet with plenty of fruits and vegetables. Blood tests for lipids and cholesterol should begin at age 20 and be repeated every 5 years. If your lipid or cholesterol levels are high, you are over 50, or you are a high risk for heart disease, you may need your cholesterol levels checked more frequently.Ongoing high lipid and cholesterol levels should be treated with medicines if diet and exercise are not effective.   If you smoke, find out from your caregiver how to quit. If you do not use tobacco, do not start.    If you are pregnant, do not drink alcohol. If you are breastfeeding, be very cautious about drinking alcohol. If you are not pregnant and choose to drink alcohol, do not exceed 1 drink per day. One drink is considered to be 12 ounces (355 mL) of beer, 5 ounces (148 mL) of wine, or 1.5 ounces (44 mL) of liquor.   Avoid use of street drugs. Do not share needles with anyone. Ask for help if you need support or instructions about stopping the use of drugs.   High blood pressure causes heart disease and increases the risk of stroke. Blood pressure should be checked at least every 1 to 2 years. Ongoing high blood pressure should be treated with medicines, if weight loss and exercise are not effective.   If you are 55 to 30 years old, ask your caregiver if you should take aspirin to prevent strokes.   Diabetes screening involves taking a blood sample to check your fasting blood sugar level. This should be done once every 3 years, after age 45, if you are within normal weight and without risk factors for diabetes. Testing should be considered at a younger age or be carried out more frequently if you are overweight and have at least 1 risk factor for diabetes.   Breast cancer screening is essential preventative care for women. You should practice "breast self-awareness." This means understanding the normal appearance and feel of your breasts and may include breast self-examination. Any changes detected, no matter how   small, should be reported to a caregiver. Women in their 20s and 30s should have a clinical breast exam (CBE) by a caregiver as part of a regular health exam every 1 to 3 years. After age 40, women should have a CBE every year. Starting at age 40, women should consider having a mammogram (breast X-ray) every year. Women who have a family history of breast cancer should talk to their caregiver about genetic screening. Women at a high risk of breast cancer should talk to their caregiver about having  an MRI and a mammogram every year.   The Pap test is a screening test for cervical cancer. Women should have a Pap test starting at age 21. Between ages 21 and 29, Pap tests should be repeated every 2 years. Beginning at age 30, you should have a Pap test every 3 years as long as the past 3 Pap tests have been normal. If you had a hysterectomy for a problem that was not cancer or a condition that could lead to cancer, then you no longer need Pap tests. If you are between ages 65 and 70, and you have had normal Pap tests going back 10 years, you no longer need Pap tests. If you have had past treatment for cervical cancer or a condition that could lead to cancer, you need Pap tests and screening for cancer for at least 20 years after your treatment. If Pap tests have been discontinued, risk factors (such as a new sexual partner) need to be reassessed to determine if screening should be resumed. Some women have medical problems that increase the chance of getting cervical cancer. In these cases, your caregiver may recommend more frequent screening and Pap tests.   The human papillomavirus (HPV) test is an additional test that may be used for cervical cancer screening. The HPV test looks for the virus that can cause the cell changes on the cervix. The cells collected during the Pap test can be tested for HPV. The HPV test could be used to screen women aged 30 years and older, and should be used in women of any age who have unclear Pap test results. After the age of 30, women should have HPV testing at the same frequency as a Pap test.   Colorectal cancer can be detected and often prevented. Most routine colorectal cancer screening begins at the age of 50 and continues through age 75. However, your caregiver may recommend screening at an earlier age if you have risk factors for colon cancer. On a yearly basis, your caregiver may provide home test kits to check for hidden blood in the stool. Use of a small camera at  the end of a tube, to directly examine the colon (sigmoidoscopy or colonoscopy), can detect the earliest forms of colorectal cancer. Talk to your caregiver about this at age 50, when routine screening begins. Direct examination of the colon should be repeated every 5 to 10 years through age 75, unless early forms of pre-cancerous polyps or small growths are found.   Hepatitis C blood testing is recommended for all people born from 1945 through 1965 and any individual with known risks for hepatitis C.   Practice safe sex. Use condoms and avoid high-risk sexual practices to reduce the spread of sexually transmitted infections (STIs). Sexually active women aged 25 and younger should be checked for Chlamydia, which is a common sexually transmitted infection. Older women with new or multiple partners should also be tested for Chlamydia. Testing for other   STIs is recommended if you are sexually active and at increased risk.   Osteoporosis is a disease in which the bones lose minerals and strength with aging. This can result in serious bone fractures. The risk of osteoporosis can be identified using a bone density scan. Women ages 82 and over and women at risk for fractures or osteoporosis should discuss screening with their caregivers. Ask your caregiver whether you should be taking a calcium supplement or vitamin D to reduce the rate of osteoporosis.   Menopause can be associated with physical symptoms and risks. Hormone replacement therapy is available to decrease symptoms and risks. You should talk to your caregiver about whether hormone replacement therapy is right for you.   Use sunscreen with a sun protection factor (SPF) of 30 or greater. Apply sunscreen liberally and repeatedly throughout the day. You should seek shade when your shadow is shorter than you. Protect yourself by wearing long sleeves, pants, a wide-brimmed hat, and sunglasses year round, whenever you are outdoors.   Notify your caregiver  of new moles or changes in moles, especially if there is a change in shape or color. Also notify your caregiver if a mole is larger than the size of a pencil eraser.   Stay current with your immunizations.  Document Released: 12/21/2010 Document Revised: 05/27/2011 Document Reviewed: 12/21/2010 [facility] Patient Information 2012 [place], [place].  Rephresh or [employer] a la semana para prevenir olor y infeccion vaginal. No necesita receta.  Receta para Xanax esta en farmacia.  Vaginosis bacteriana (Bacterial Vaginosis) La vaginosis bacteriana es una infeccin vaginal en la que el equilibrio normal de las bacterias de la vagina se modifica. Este equilibrio normal se ve afectado por un desarrollo excesivo de ciertas bacterias. Hay diferentes tipos de bacteria que causan la vaginosis bacteriana. Es el problema vaginal ms comn en las mujeres de edad frtil. CAUSAS  La causa de este trastorno no se conoce bien. Se produce como consecuencia de un aumento o desequilibrio de las bacterias nocivas.   Algunas actividades o conductas pueden poner en peligro el equilibrio normal de las bacterias en la vagina, y Astronomer. Entre ellas:   Tener un compaero sexual o mltiples compaeros sexuales.   Las duchas vaginales   Usar un dispositivo intrauterino (DIU) como mtodo anticonceptivo.   No se conoce el papel que juega la actividad sexual en el desarrollo de [place] VB. Sin embargo, las mujeres que nunca tuvieron relaciones sexuales raramente se infectan.  El contagio no se produce en asientos de baos, camas, piscinas o por tocar objetos.  SNTOMAS  Flujo vaginal grisceo.   Olor parecido al pescado con la secrecin, en especial despus de Management consultant.   Picazn o irritacin de la vagina y la vulva.   Ardor o dolor al [employer].   Algunas mujeres no presentan ningn sntoma.  DIAGNSTICO El mdico realizar un examen vaginal para diagnosticar una vaginosis  bacteriana. El mdico le indicar anlisis de laboratorio y observar las muestras del lquido vaginal en el microscopio. Buscar bacterias y clulas anormales (clulas clave), pH mayor a 4.5 y [place] prueba de aminas positivo, todos ellos asociados al BV.  RIESGOS Y COMPLICACIONES  Enfermedad plvica inflamatoria (EPI).   Infecciones luego de una ciruga ginecolgica.   VIH.   Virus del Herpes  TRATAMIENTO En algunos casos, la infeccin desaparece sin tratamiento. Sin embargo, todas las mujeres con sntomas de VB deben tratarse para evitar complicaciones, especialmente si se ha planificado una ciruga ginecolgica.  Los compaeros varones generalmente no necesitan tratamiento. Sin embargo, puede contagiarse entre parejas femeninas, de modo que el tratamiento se realiza para Dietitian.   La VB puede tratarse con medicamentos que destruyen grmenes (antibiticos). Estos se presentan en pldoras o en cremas vaginales. Tanto mujeres embarazadas como no embarazadas pueden usar ambos, pero se indican en dosis diferentes. Estos antibiticos no daan al beb.   La VB puede recurrir [employer]. Si esto ocurre, se prescribir un segundo tratamiento con antibiticos.   El tratamiento es importante en el caso de las mujeres [place]. Si no se trata, la VB puede causar [employer], especialmente en [employer] que ha tenido un parto prematuro en el pasado. Todas las mujeres embarazadas que tienen sntomas de VB deben ser controladas y tratadas.   En los casos de recurrencia crnica, se prescribe un tratamiento con un gel vaginal dos veces por semana  INSTRUCCIONES PARA EL CUIDADO DOMICILIARIO  Tome los medicamentos que le indic el mdico.   No mantenga relaciones sexuales Librarian, academic.   Comunique a sus compaeros sexuales que sufre una infeccin vaginal. Ellos deben concurrir para un control mdico si tienen problemas como una urticaria leve o picazn.    Practique el sexo seguro. Use preservativos. Tenga un solo compaero sexual.  PREVENCIN Algunos pasos bsicos de prevencin pueden ayudar a reducir el riesgo de desequilibrio de las bacterias vaginales y de sufrir VB.  No mantener relaciones sexuales (abstinencia)   No utilice duchas vaginales.   Utilice todos los [employer] han prescripto para el [place], aunque los sntomas hayan desaparecido.   Comunique a su compaero sexual que sufre una VB. De ese modo podr tratase, si es necesario, y podr Economist.  SOLICITE ATENCIN MDICA SI:  Los sntomas no mejoran luego de [address]  [address] de [place].   Aumentan la secrecin, el dolor o la fiebre.  ASEGRESE QUE:   Comprende estas instrucciones.   Controlar su enfermedad.   Solicitar ayuda de inmediato si no mejora o empeora.  PARA MS INFORMACIN: Division de STD Prevention (DSTDP), Centers for Disease Control and Prevention (Centros para el control y la prevencin de enfermedades, CDC): SolutionApps.co.za American Social Health Association (ASHA): www.ashastd.org  Document Released: 09/14/2007 Document Revised: 05/27/2011 [facility] Patient Information 2012 [place], [place].

## 2011-12-29 LAB — GC/CHLAMYDIA PROBE AMP, GENITAL
Chlamydia, DNA Probe: NEGATIVE
GC Probe Amp, Genital: NEGATIVE

## 2012-01-04 ENCOUNTER — Encounter: Payer: Self-pay | Admitting: Gynecology

## 2012-01-04 ENCOUNTER — Ambulatory Visit (INDEPENDENT_AMBULATORY_CARE_PROVIDER_SITE_OTHER): Payer: 59 | Admitting: Gynecology

## 2012-01-04 VITALS — BP 122/78

## 2012-01-04 DIAGNOSIS — Z30432 Encounter for removal of intrauterine contraceptive device: Secondary | ICD-10-CM

## 2012-01-04 DIAGNOSIS — R87613 High grade squamous intraepithelial lesion on cytologic smear of cervix (HGSIL): Secondary | ICD-10-CM

## 2012-01-04 DIAGNOSIS — IMO0002 Reserved for concepts with insufficient information to code with codable children: Secondary | ICD-10-CM

## 2012-01-04 NOTE — Progress Notes (Signed)
31 y.o. who was seen last week for annual gyn exam who stated that she had a discharge with odor before her menses and after menses. She had a Mirena IUD from 2010 to July of 2012 and we switched it because she said that she had spotting at different times and did not know when her cycles was going to start. She was changed to a ParaGard T380A IUD. She would like to return back to the Mirena IUD. She states her cycles last 3-4 days. She has a new sexual partner for the past 4 months. She was recently treated for BV with Flagyl 500 mg twice a day for 5 days which she completed the treatment. Her recent GC and Chlamydia culture was negative as well.   Review of her record indicated that in 2012 her Pap smear had demonstrated atypical squamous cells of undetermined significance but no high-risk HPV was noted and she had a negative colposcopy and biopsy. 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  We had discussed the findings it was recommended that she can just have her IUD removed today and proceed with a colposcopic directed LEEP cervical conization and then in a few months from now to replace her IUD. Once she recovers from the LEEP cervical conization she can use barrier contraception until with replace her IUD. The IUD was removed shown to the patient and discarded.  She underwent a detail colposcopic evaluation the external genitalia vagina fornix and cervix were evaluated as well as the perineum and perirectal region. After acetic acid was applied to the vagina the following was noted:  Physical Exam  Genitourinary:     a LEEP procedure was done as follows:LEEP (Leep electrosurgical excision procedure)    Patient Name:Stacy Romero  Record [id number]  Indication For Surgery: High grade squamous intraepithelial lesion  Surgeon: Reynaldo Minium H  Anesthesia: 1%  lidocaine paracervical block 248 and 10:00 position 10 cc   Procedure:  LEEP (Loop electrosurgical excision procedure) Description of Operation:  After the patient was verbally counseled the patient was placed in the low lithotomy position.  A coated speculum was inserted into the vagina and colposcopic examination was performed with 4% acidic acid with findings noted above.  The cervix was then painted with Lugol's solution to delineate the margins of the lesion and transformation zone.  Approximately 10 cc's of 1 % xylocaine with epinephrine was infiltrated deep near the outer margin of the transformation zone circumferentially at 12, 3, 6, and 9 o'clock positions.  The [facility] Electrosurgical Generator was then turned on after the patient was grounded with pad electrode on her thigh and jewelry removed.  The settings on the generator were Blend 1 current 70 watts cut and 70 watts on the coagulation mode.  A size 20 x 12 loop electrode was utilized to exercise the atypical transformation zone.  The tip of the electrode was placed 3 mm from the edge of the lesion at 3, 6, 9 and 12 o'clock position.  The electrode was moved slowly over the lesion was within the loop limits.  A vaginal wall retractor was used.  The loop was then repositioned and the finger switch on the hand held piece was activated ( or footpedal depressed).  A slight pressure on the shaft was applied and the loop was extended into the tissue up to its crossbar to a depth of 6 mm, then with  steady, slow motion across and underneath the endocervical button was excised.  The loop electrode was replaced with ball electrode set at 50 watts and the base of the crater was fulgurated circumferentially.  Monsell's paint was then applied for additional hemostasis.  Patient tolerated the procedure well with minimal blood loss and without any complications.  After the procedure patient left office with stable vital signs and instructions  sheet. We will notify the patient when the results become available and plan a course of management. All the above was discussed in Spanish with the patient and literature formation was provided as well. All questions are answered and we'll follow accordingly.   [facility] HMD5:18 PMTD@

## 2012-01-04 NOTE — Patient Instructions (Signed)
Conizacin cervical (Conization of the Cervix) La conizacin en la escisin de una porcin del cuello del tero en forma de cono. Este procedimiento se realiza generalmente cuando hay una hemorragia anormal que proviene del cuello del tero. Tambin se realiza para evaluar un Papanicolaou anormal o anormalidades observadas en el cuello durante un examen. Este procedimiento no se realiza durante el periodo menstrual o el embarazo. ANTES DEL PROCEDIMIENTO  No ingiera alimentos y bebidas durante 6 a 8 horas antes del procedimiento, especialmente si van a administrarle medicamentos para dormirla (anestesia general).   No tome aspirina ni anticoagulantes durante la semana previa al procedimiento, o segn le hayan indicado.   Llegue al menos con una hora de anticipacin al procedimiento para leer y firmar los formularios necesarios.   Pdale a alguna persona que la lleve a su casa luego del procedimiento.   Si fuma, no lo haga durante las dos semanas anteriores al procedimiento.  INFORME AL PROFESIONAL QUE LA ASISTE SOBRE LOS SIGUIENTES PUNTOS:  Alergias a alimentos o medicamentos.   Medicamentos que toma, incluyendo medicamentos de venta libre, medicamentos prescritos, hierbas, gotas oftlmicas y cremas.   Sufre un resfro o una infeccin.   Si consume drogas o bebe demasiado alcohol.   Su hbito de fumar.   Problemas anteriores debido a anestsicos o a la novocana.   Si existe un posible embarazo.   Antecedentes cogulos sanguneos u otros problemas de sangrado.   Otros problemas mdicos.  RIESGOS Y COMPLICACIONES  Sangrado.   Infeccin.   Lesin en el cuello del tero.   Lesin en los rganos circundantes.   Problemas con la anestesia.  PROCEDIMIENTO La conizacin cervical puede realizarse por los siguientes medios:  Bistur fro. Con este mtodo se recorta el canal cervical y la zona de transformacin (donde terminan las clulas normales y comienzan las anormales) con un  bistur.   LEEP  (electrocauterizacin). Se lleva a cabo con un cable delgado que puede cortar y quemar (cauterizar) el tejido cervical con corriente elctrica.   Tratamiento lser . Se corta y se quema (cauteriza) el tejido del cuello del tero para evitar hemorragias con el rayo lser.  Le administrarn un medicamento para dormirla (anestesia general) para el procedimiento con bistur fro o con lser y un medicamento para adormecer la zona (anestesia local) en el procedimiento LEEP. La conizacin generalmente se realiza con colposcopia. La colposcopia aumenta la visin del cuello del tero para verlo con ms claridad.El tejido extirpado es examinado en el microscopio por un mdico especialista (patlogo). El patlogo le dar un informe a su mdico personal. Esto ayudar al mdico decidir si es necesario realizar otro tratamiento. Esta prueba tambin ayudar al profesional a decidir cul es el mejor tratamiento posible, en caso que el resultado no sea normal.  DESPUS DEL PROCEDIMIENTO  Si le han administrado anestesia general se sentir mareada durante algunas horas luego del procedimiento.   Si le han aplicado un anestsico local, le indicarn reposo en el centro quirrgico o en el consultorio del mdico hasta que se encuentre estable y se sienta lista por volver a su casa.   Pdale a alguna persona que la lleve hasta su domicilio.   Podr sentir algunos clicos durante algunos das.   Es posible que tenga una secrecin sanguinolenta o leve sangrado durante 2 a 4 semanas.   Puede observar una secrecin de color negro que proviene de la vagina. Es la crema utilizada en el cuello para evitar el sangrado. Esto es normal.  INSTRUCCIONES   PARA EL CUIDADO DOMICILIARIO  No  conduzca vehculos por 24 horas.   Evite las actividades extenuantes y la prctica de ejercicios durante al menos 7 a 10 das.   Solo tome medicamentos de venta libre o recetados para el dolor, el malestar o la fiebre, segn  las indicaciones del profesional.   No tome aspirina. Esta puede ocasionar hemorragias o agravarlas.   Puede retomar su dieta habitual.   Beba entre 6 y 8 vasos de lquidos por da.   Descanse y duerma durante las primeras 24 a 48 horas.   Tome duchas en lugar de baos de inmersin hasta que el mdico la autorice.   No use tampones, no se d duchas vaginales ni tenga relaciones sexuales durante 4 semanas, o segn lo que le indique el mdico.   No levante objetos pesados de ms de 10 libras (4,5 kg) durante al menos 7 a 10 das, o segn lo que le indique el mdico.   Tmese la temperatura dos veces por da, durante 4 a 5 das. Antelas.   No beba alcohol ni conduzca mientras toma analgsicos.   Si est constipada:   Tome un laxante suave con autorizacin de su mdico.   Consuma alimentos que contengan salvado.   Beba gran cantidad de lquidos.   Pdale a alguna persona que permanezca con usted durante las primeras 24 a 48 horas, especialmente si le han administrado anestesia general.   Asegrese que usted y su familia comprenden todo acerca de la ciruga y la recuperacin.   Siga las indicaciones de su mdico con respecto a las visitas de control y el papanicolaou.  SOLICITE ATENCIN MDICA SI:  Aparece una erupcin cutnea.   Se siente mareada o sufre un desmayo.   Tiene malestar estomacal (nuseas).   Observa una secrecin vaginal con mal olor.   Tiene una reaccin alrgica al medicamento.   Necesita analgsicos ms fuertes.  SOLICITE ATENCIN MDICA DE INMEDIATO SI:  Tiene cogulos sanguneos o un sangrado ms abundante que un periodo menstrual normal, o si el sangrado es de color rojo brillante.   La temperatura oral se eleva sin motivo por encima de 102 F (38.9 C) o segn le indique el profesional que la asiste.   Siente cada vez ms clicos.   Se desmaya.   Comienza a sentir dolor al orinar u observa sangre.   Comienza a vomitar.   El dolor no se  alivia con los medicamentos.  Durante su visita no contar con todos los resultados de los anlisis. En este caso, tenga otra entrevista con su mdico para conocerlos. No piense que el resultado es normal si no tiene noticias de su mdico o de la institucin mdica. Es importante el seguimiento de todos los resultados de los anlisis. Document Released: 03/17/2005 Document Revised: 05/27/2011 [employer] Patient Information 2012 [employer]. 

## 2012-01-18 ENCOUNTER — Encounter: Payer: Self-pay | Admitting: Gynecology

## 2012-01-18 ENCOUNTER — Ambulatory Visit (INDEPENDENT_AMBULATORY_CARE_PROVIDER_SITE_OTHER): Payer: 59 | Admitting: Gynecology

## 2012-01-18 VITALS — BP 120/78

## 2012-01-18 DIAGNOSIS — IMO0002 Reserved for concepts with insufficient information to code with codable children: Secondary | ICD-10-CM

## 2012-01-18 DIAGNOSIS — Z9889 Other specified postprocedural states: Secondary | ICD-10-CM

## 2012-01-18 DIAGNOSIS — R87613 High grade squamous intraepithelial lesion on cytologic smear of cervix (HGSIL): Secondary | ICD-10-CM

## 2012-01-18 DIAGNOSIS — G4700 Insomnia, unspecified: Secondary | ICD-10-CM

## 2012-01-18 MED ORDER — METRONIDAZOLE 0.75 % VA GEL: VAGINAL | Status: DC

## 2012-01-18 MED ORDER — ZOLPIDEM TARTRATE 10 MG PO TABS: 10.0000 mg | ORAL_TABLET | Freq: Every evening | ORAL | Status: DC | PRN

## 2012-01-18 NOTE — Progress Notes (Signed)
Patient presented to the office today for her two-week postop visit after having undergone a LEEP cervical conization as a result of the following:  Review of her record indicated that in 2012 her Pap smear had demonstrated atypical squamous cells of undetermined significance but no high-risk HPV was noted and she had a negative colposcopy and biopsy. 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  Her LEEP cervical conization pathology report as follows:  Diagnosis 1. Cervix, LEEP, cervical button - BENIGN ENDOCERVIX. - NO DYSPLASIA. 2. Cervix, LEEP - SEVERE SQUAMOUS DYSPLASIA, CIN-III INVOLVING THE ENDOCERVICAL MARGIN. - ECTOCERVICAL MARGIN NOT INVOLVED.  She is doing well. 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 Patient was prescribed MetroGel vaginal cream to apply each bedtime for one week. She may then resume sexual activity. She will use condoms for contraception. After she has her second menstrual cycle she will return to the office to place the Mirena IUD once again. The above pathology findings and recommended close surveillance was discussed with the patient in Spanish and all questions are answered and we'll follow accordingly.

## 2012-02-20 HISTORY — PX: INTRAUTERINE DEVICE (IUD) INSERTION: SHX5877

## 2012-02-25 ENCOUNTER — Ambulatory Visit (INDEPENDENT_AMBULATORY_CARE_PROVIDER_SITE_OTHER): Payer: 59 | Admitting: Gynecology

## 2012-02-25 ENCOUNTER — Encounter: Payer: Self-pay | Admitting: Gynecology

## 2012-02-25 VITALS — BP 120/78

## 2012-02-25 DIAGNOSIS — F419 Anxiety disorder, unspecified: Secondary | ICD-10-CM

## 2012-02-25 DIAGNOSIS — Z30430 Encounter for insertion of intrauterine contraceptive device: Secondary | ICD-10-CM

## 2012-02-25 DIAGNOSIS — G4700 Insomnia, unspecified: Secondary | ICD-10-CM

## 2012-02-25 DIAGNOSIS — F411 Generalized anxiety disorder: Secondary | ICD-10-CM

## 2012-02-25 MED ORDER — ALPRAZOLAM 0.5 MG PO TABS: ORAL_TABLET | ORAL | Status: DC

## 2012-02-25 NOTE — Progress Notes (Signed)
Patient is 6 weeks status post LEEP cervical conization as a result of the following:  Review of her record indicated that in 2012 her Pap smear had demonstrated atypical squamous cells of undetermined significance but no high-risk HPV was noted and she had a negative colposcopy and biopsy. 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  Her LEEP cervical conization pathology report as follows:  Diagnosis  1. Cervix, LEEP, cervical button  - BENIGN ENDOCERVIX.  - NO DYSPLASIA.  2. Cervix, LEEP  - SEVERE SQUAMOUS DYSPLASIA, CIN-III INVOLVING THE ENDOCERVICAL MARGIN.  - ECTOCERVICAL MARGIN NOT INVOLVED.   She presented to the office today to have the Mirena IUD placed. Patient had used a Mirena IUD in the past. Had received literature information read it. Consent form was signed. She is fully aware that this form of contraception is good for 5 years is 99% effective.  Procedure: A bimanual pelvic examination was undertaken first to confirm the anteverted uterine position. The cervix was then cleansed with Betadine solution. The cervical bed was well healed from previous LEEP cervical conization A  single-tooth tenaculum was placed on the anterior cervical lip And the uterus was sounded to 7 cm. The IUD was placed in a sterile fashion and the string was trimmed. The single-tooth tenaculum was removed.  Patient will return back to the office in one month for followup. She will then return in 5 months for followup Pap smear. All the above was discussed with the patient in Spanish and we'll follow accordingly.

## 2012-02-25 NOTE — Patient Instructions (Addendum)
Intrauterine Device Information An intrauterine device (IUD) is inserted into your uterus and prevents pregnancy. There are 2 types of IUDs available:  Copper IUD. This type of IUD is wrapped in copper wire and is placed inside the uterus. Copper makes the uterus and fallopian tubes produce a fluid that kills sperm. The copper IUD can stay in place for 10 years.   Hormone IUD. This type of IUD contains the hormone progestin (synthetic progesterone). The hormone thickens the cervical mucus and prevents sperm from entering the uterus, and it also thins the uterine lining to prevent implantation of a fertilized egg. The hormone can weaken or kill the sperm that get into the uterus. The hormone IUD can stay in place for 5 years.  Your caregiver will make sure you are a good candidate for a contraceptive IUD. Discuss with your caregiver the possible side effects. ADVANTAGES  It is highly effective, reversible, long-acting, and low maintenance.   There are no estrogen-related side effects.   An IUD can be used when breastfeeding.   It is not associated with weight gain.   It works immediately after insertion.   The copper IUD does not interfere with your female hormones.   The progesterone IUD can make heavy menstrual periods lighter.   The progesterone IUD can be used for 5 years.   The copper IUD can be used for 10 years.  DISADVANTAGES  The progesterone IUD can be associated with irregular bleeding patterns.   The copper IUD can make your menstrual flow heavier and more painful.   You may experience cramping and vaginal bleeding after insertion.  Document Released: 05/11/2004 Document Revised: 05/27/2011 Document Reviewed: 10/10/2010 [employer] Patient Information 2012 [employer]. 

## 2012-02-28 ENCOUNTER — Encounter: Payer: Self-pay | Admitting: Gynecology

## 2012-03-02 ENCOUNTER — Encounter (HOSPITAL_COMMUNITY): Payer: Self-pay | Admitting: Emergency Medicine

## 2012-03-02 ENCOUNTER — Emergency Department (HOSPITAL_COMMUNITY)
Admission: EM | Admit: 2012-03-02 | Discharge: 2012-03-02 | Disposition: A | Discharge status: Home / Self Care | Payer: 59 | Attending: Emergency Medicine | Admitting: Emergency Medicine

## 2012-03-02 DIAGNOSIS — Z833 Family history of diabetes mellitus: Secondary | ICD-10-CM | POA: Insufficient documentation

## 2012-03-02 DIAGNOSIS — M545 Low back pain, unspecified: Secondary | ICD-10-CM | POA: Insufficient documentation

## 2012-03-02 DIAGNOSIS — Z8249 Family history of ischemic heart disease and other diseases of the circulatory system: Secondary | ICD-10-CM | POA: Insufficient documentation

## 2012-03-02 DIAGNOSIS — M542 Cervicalgia: Secondary | ICD-10-CM | POA: Insufficient documentation

## 2012-03-02 DIAGNOSIS — F411 Generalized anxiety disorder: Secondary | ICD-10-CM | POA: Insufficient documentation

## 2012-03-02 MED ORDER — HYDROCODONE-ACETAMINOPHEN 5-325 MG PO TABS: 1.0000 | ORAL_TABLET | Freq: Every evening | ORAL | Status: AC | PRN

## 2012-03-02 MED ORDER — HYDROCODONE-ACETAMINOPHEN 5-325 MG PO TABS
1.0000 | ORAL_TABLET | Freq: Once | ORAL | Status: AC
  Administered 2012-03-02: 1 via ORAL
  Filled 2012-03-02: qty 1

## 2012-03-02 MED ORDER — CYCLOBENZAPRINE HCL 10 MG PO TABS: 5.0000 mg | ORAL_TABLET | Freq: Three times a day (TID) | ORAL | Status: AC | PRN

## 2012-03-02 MED ORDER — IBUPROFEN 600 MG PO TABS: 600.0000 mg | ORAL_TABLET | Freq: Four times a day (QID) | ORAL | Status: AC | PRN

## 2012-03-02 MED ORDER — IBUPROFEN 400 MG PO TABS
600.0000 mg | ORAL_TABLET | Freq: Once | ORAL | Status: AC
  Administered 2012-03-02: 600 mg via ORAL
  Filled 2012-03-02: qty 1
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
## 2012-03-03 NOTE — ED Provider Notes (Signed)
Medical screening examination/treatment/procedure(s) were performed by non-physician practitioner and as supervising physician I was immediately available for consultation/collaboration.   Lennix Kneisel, MD 03/03/12 0040 

## 2012-03-10 ENCOUNTER — Ambulatory Visit (INDEPENDENT_AMBULATORY_CARE_PROVIDER_SITE_OTHER): Payer: 59 | Admitting: Gynecology

## 2012-03-10 ENCOUNTER — Encounter: Payer: Self-pay | Admitting: Gynecology

## 2012-03-10 VITALS — BP 124/78

## 2012-03-10 DIAGNOSIS — Z30431 Encounter for routine checking of intrauterine contraceptive device: Secondary | ICD-10-CM

## 2012-03-10 NOTE — Progress Notes (Signed)
Patient 31 year old who was seen in the office on September 6 her she by had a Mirena IUD placed. She presented today for followup and have the IUD string trimmed.  Exam: Abdomen: Soft nontender no rebound or guarding Pelvic: Bartholin urethra Skene was within normal limits Vagina: No lesions or discharge Cervix: IUD string seen and trimmed. Uterus: Anteverted normal size shape and consistency Adnexa: No palpable masses or tenderness Rectal exam: Not done  Assessment/plan: Patient status post placement of Mirena IUD 02/25/2012 doing well with no problem. IUD string was trimmed as per patient's request. Patient with past dysplasia history as follows:  Review of her record indicated that in 2012 her Pap smear had demonstrated atypical squamous cells of undetermined significance but no high-risk HPV was noted and she had a negative colposcopy and biopsy. 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  Her LEEP cervical conization pathology report as follows:  Diagnosis  1. Cervix, LEEP, cervical button  - BENIGN ENDOCERVIX.  - NO DYSPLASIA.  2. Cervix, LEEP  - SEVERE SQUAMOUS DYSPLASIA, CIN-III INVOLVING THE ENDOCERVICAL MARGIN.  - ECTOCERVICAL MARGIN NOT INVOLVED.   Patient is otherwise scheduled to return back to the office in 6 months for followup Pap smear.

## 2012-03-24 ENCOUNTER — Ambulatory Visit: Payer: 59 | Admitting: Gynecology

## 2012-04-11 ENCOUNTER — Ambulatory Visit (INDEPENDENT_AMBULATORY_CARE_PROVIDER_SITE_OTHER): Payer: 59 | Admitting: Gynecology

## 2012-04-11 ENCOUNTER — Encounter: Payer: Self-pay | Admitting: Gynecology

## 2012-04-11 VITALS — BP 118/70

## 2012-04-11 DIAGNOSIS — N898 Other specified noninflammatory disorders of vagina: Secondary | ICD-10-CM

## 2012-04-11 DIAGNOSIS — F419 Anxiety disorder, unspecified: Secondary | ICD-10-CM

## 2012-04-11 DIAGNOSIS — F411 Generalized anxiety disorder: Secondary | ICD-10-CM

## 2012-04-11 DIAGNOSIS — Z23 Encounter for immunization: Secondary | ICD-10-CM

## 2012-04-11 DIAGNOSIS — Z113 Encounter for screening for infections with a predominantly sexual mode of transmission: Secondary | ICD-10-CM

## 2012-04-11 DIAGNOSIS — N949 Unspecified condition associated with female genital organs and menstrual cycle: Secondary | ICD-10-CM

## 2012-04-11 LAB — WET PREP FOR TRICH, YEAST, CLUE

## 2012-04-11 MED ORDER — METRONIDAZOLE 500 MG PO TABS: 500.0000 mg | ORAL_TABLET | Freq: Two times a day (BID) | ORAL | Status: DC

## 2012-04-11 MED ORDER — PAROXETINE HCL ER 12.5 MG PO TB24: 12.5000 mg | ORAL_TABLET | ORAL | Status: DC

## 2012-04-11 NOTE — Patient Instructions (Signed)
Vaginosis bacteriana (Bacterial Vaginosis) La vaginosis bacteriana es una infeccin vaginal en la que el equilibrio normal de las bacterias de la vagina se modifica. Este equilibrio normal se ve afectado por un desarrollo excesivo de ciertas bacterias. Hay diferentes tipos de bacteria que causan la vaginosis bacteriana. Es el problema vaginal ms comn en las mujeres de edad frtil. CAUSAS  La causa de este trastorno no se conoce bien. Se produce como consecuencia de un aumento o desequilibrio de las bacterias nocivas.  Algunas actividades o conductas pueden poner en peligro el equilibrio normal de las bacterias en la vagina, y Astronomer. Entre ellas:  Tener un compaero sexual o mltiples compaeros sexuales.  Las duchas vaginales  Usar un dispositivo intrauterino (DIU) como mtodo anticonceptivo.  No se conoce el papel que juega la actividad sexual en el desarrollo de Solon VB. Sin embargo, las mujeres que nunca tuvieron relaciones sexuales raramente se infectan. El contagio no se produce en asientos de baos, camas, piscinas o por tocar objetos.  SNTOMAS  Flujo vaginal grisceo.  Olor parecido al pescado con la secrecin, en especial despus de Management consultant.  Picazn o irritacin de la vagina y la vulva.  Ardor o dolor al [employer].  Algunas mujeres no presentan ningn sntoma. DIAGNSTICO El mdico realizar un examen vaginal para diagnosticar una vaginosis bacteriana. El mdico le indicar anlisis de laboratorio y observar las muestras del lquido vaginal en el microscopio. Buscar bacterias y clulas anormales (clulas clave), pH mayor a 4.5 y [place] prueba de aminas positivo, todos ellos asociados al BV.  RIESGOS Y COMPLICACIONES  Enfermedad plvica inflamatoria (EPI).  Infecciones luego de una ciruga ginecolgica.  VIH.  Virus del Herpes TRATAMIENTO En algunos casos, la infeccin desaparece sin tratamiento. Sin embargo, todas las mujeres con sntomas  de VB deben tratarse para evitar complicaciones, especialmente si se ha planificado una ciruga ginecolgica. Los compaeros varones generalmente no necesitan tratamiento. Sin embargo, puede contagiarse entre parejas femeninas, de modo que el tratamiento se realiza para Dietitian.   La VB puede tratarse con medicamentos que destruyen grmenes (antibiticos). Estos se presentan en pldoras o en cremas vaginales. Tanto mujeres embarazadas como no embarazadas pueden usar ambos, pero se indican en dosis diferentes. Estos antibiticos no daan al beb.  La VB puede recurrir [employer]. Si esto ocurre, se prescribir un segundo tratamiento con antibiticos.  El tratamiento es importante en el caso de las mujeres [place]. Si no se trata, la VB puede causar [employer], especialmente en [employer] que ha tenido un parto prematuro en el pasado. Todas las mujeres embarazadas que tienen sntomas de VB deben ser controladas y tratadas.  En los casos de recurrencia crnica, se prescribe un tratamiento con un gel vaginal dos veces por semana INSTRUCCIONES PARA EL CUIDADO DOMICILIARIO  Tome los medicamentos que le indic el mdico.  No mantenga relaciones sexuales Librarian, academic.  Comunique a sus compaeros sexuales que sufre una infeccin vaginal. Ellos deben concurrir para un control mdico si tienen problemas como una urticaria leve o picazn.  Practique el sexo seguro. Use preservativos. Tenga un solo compaero sexual. PREVENCIN Algunos pasos bsicos de prevencin pueden ayudar a reducir el riesgo de desequilibrio de las bacterias vaginales y de sufrir VB.  No mantener relaciones sexuales (abstinencia)  No utilice duchas vaginales.  Utilice todos los [employer] han prescripto para el [place], aunque los sntomas hayan desaparecido.  Comunique a su compaero sexual que sufre una VB. Otho Najjar modo  podr tratase, si es necesario, y podr Therapist, art. SOLICITE ATENCIN MDICA SI:  Los sntomas no mejoran luego de [address]  [address] de [place].  Aumentan la secrecin, el dolor o la fiebre. ASEGRESE QUE:   Comprende estas instrucciones.  Controlar su enfermedad.  Solicitar ayuda de inmediato si no mejora o empeora. PARA MS INFORMACIN: Division de STD Prevention (DSTDP), Centers for Disease Control and Prevention (Centros para el control y la prevencin de enfermedades, CDC): SolutionApps.co.za American Social Health Association (ASHA): www.ashastd.org  Document Released: 09/14/2007 Document Revised: 08/30/2011 [facility] Patient Information 2013 [place], [place].  Ansiedad y crisis de [place] (Anxiety and Panic Attacks) El profesional que lo asiste le ha informado que usted padece ansiedad o crisis de [place]. Este trastorno puede presentarse de [employer]. La mayor parte de las veces las crisis aparecen de modo repentino y sin aviso. Se producen en cualquier momento del da, incluso durante el sueo, y en cualquier etapa de la vida. Pueden ser muy intensas e inexplicadas. Aunque un ataque de pnico puede ser muy atemorizante, no produce daos fsicos. Algunas veces se desconoce la causa de la ansiedad. La ansiedad es un mecanismo protector del organismo en su respuesta de lucha o escape. Muchas de estas situaciones de percepcin de peligro son en realidad situaciones no fsicas (como la ansiedad de perder el [place]). CAUSAS Las causas de la ansiedad o de un ataque de pnico pueden ser [place]. Los ataques de pnico pueden ocurrir en personas sanas en una serie de circunstancias. Es posible que haya una causa gentica de los ataques de pnico. Algunos medicamentos pueden provocar ansiedad como efecto secundario. SNTOMAS Algunas de las sensaciones ms comunes son:  Terror intenso.   Vahdos, desfallecimiento.   Golpes de fro y Airline pilot.   Temor a Estate manager/land agent.   Sentimiento de irrealidad.   Sudoracin.   Temblores.    Dolor en el pecho y latidos irregulares (palpitaciones).   Sensaciones de [employer] o sofocos.   Sentimiento de peligro inminente y de que la muerte est prxima.   Hormigueo en las extremidades que puede venir de la respiracin agitada.   Alteracin de la realidad (desrealizacin).   Sentirse separado de uno mismo (despersonalizacin).  Estos son los sntomas (problemas) ms comunes y pueden combinarse para presentar la crisis de [place].  DIAGNSTICO La evaluacin que realice el profesional depender del tipo de sntomas que est experimentando. El diagnstico de la ansiedad o de ataque de pnico se realiza cuando no se encuentra ninguna enfermedad fsica que pueda determinarse como la causa de los sntomas. TRATAMIENTO El tratamiento para prevenir la ansiedad y los ataques de pnico incluye:  Automotive engineer las circunstancias que causen ansiedad.   Reaseguro y relajacin.   Ejercicio regular.   Terapias de relajacin, como yoga.   Psicoterapia con un psiquiatra o terapeuta.   Evitar la cafena, el alcohol y las drogas [address]   Medicamentos de prescripcin.  SOLICITE ATENCIN MDICA DE INMEDIATO SI:  Usted experimenta sntomas de ataque de pnico que son distintos a sus sntomas usuales.   Tiene cualquier sntoma que empeora o lo preocupa.  Document Released: 06/07/2005 Document Revised: 05/27/2011 [facility] Patient Information 2012 [place], [place].

## 2012-04-11 NOTE — Progress Notes (Signed)
Patient presented to the office today complaining of a foul-smelling discharge for the past few days. Patient has an IUD for contraception and believes  she may be starting her menses today. She has had a new partner 6 months ago. A few months ago she had a negative GC and Chlamydia culture. Patient also has suffers  from anxiety and stress at work and had been taking Xanax only on a when necessary basis and feels that she needs something to help her on a regular basis as well as for occasional panic attacks.  Exam: Bartholin urethra Skene glands: Within normal limits Vagina: Foul-smelling white discharge clear Cervix: No gross lesions on inspection IUD string was not seen Uterus: Not examined Adnexa: Not examined rectal exam: Not done  GC and Chlamydia culture was obtained Wet prep:Pos Amine moderate clue cells too numerous to count bacteria  Assessment/plan: #1 history of anxiety and panic attacks. She will discontinue the Xanax and began Paxil CR 12.5 mg 1 by mouth daily. The risks benefits and pros and cons of medication were discussed with the patient. 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

## 2012-05-02 ENCOUNTER — Telehealth: Payer: Self-pay | Admitting: Anesthesiology

## 2012-05-02 MED ORDER — CLINDAMYCIN PHOSPHATE 2 % VA CREA: 1.0000 | TOPICAL_CREAM | Freq: Every day | VAGINAL | Status: DC

## 2012-05-02 NOTE — Telephone Encounter (Signed)
I would recommend changing antibiotic approach and use Cleocin vaginal cream nightly x7 days. If it does continue despite the she'll need office visit for reevaluation

## 2012-05-02 NOTE — Telephone Encounter (Signed)
Called patient back and she said it never got better.Marland KitchenMarland KitchenMarland Kitchen

## 2012-05-02 NOTE — Telephone Encounter (Signed)
Did it get better and then come back or did it never get better?

## 2012-05-02 NOTE — Telephone Encounter (Signed)
PATIENT HAS BEEN INFORMED OF MESSAGE BELOW.Marland KitchenMarland KitchenMarland Kitchen

## 2012-05-02 NOTE — Telephone Encounter (Signed)
Patient was seen on October 22 by Dr. Lily Peer for vaginal odor. Patient would like a refill on flagyl she still having the same symptoms.. Please advise.Marland KitchenMarland Kitchen

## 2012-06-23 ENCOUNTER — Ambulatory Visit: Payer: 59 | Admitting: Gynecology

## 2012-06-30 ENCOUNTER — Encounter: Payer: Self-pay | Admitting: Gynecology

## 2012-06-30 ENCOUNTER — Ambulatory Visit (INDEPENDENT_AMBULATORY_CARE_PROVIDER_SITE_OTHER): Payer: 59 | Admitting: Gynecology

## 2012-06-30 ENCOUNTER — Other Ambulatory Visit (HOSPITAL_COMMUNITY)
Admission: RE | Admit: 2012-06-30 | Discharge: 2012-06-30 | Disposition: A | Discharge status: Home / Self Care | Payer: 59 | Source: Ambulatory Visit | Attending: Gynecology | Admitting: Gynecology

## 2012-06-30 ENCOUNTER — Ambulatory Visit (INDEPENDENT_AMBULATORY_CARE_PROVIDER_SITE_OTHER): Payer: 59

## 2012-06-30 ENCOUNTER — Ambulatory Visit: Payer: 59 | Admitting: Gynecology

## 2012-06-30 VITALS — BP 128/84

## 2012-06-30 DIAGNOSIS — Z01419 Encounter for gynecological examination (general) (routine) without abnormal findings: Secondary | ICD-10-CM | POA: Insufficient documentation

## 2012-06-30 DIAGNOSIS — Z30431 Encounter for routine checking of intrauterine contraceptive device: Secondary | ICD-10-CM

## 2012-06-30 DIAGNOSIS — Z1151 Encounter for screening for human papillomavirus (HPV): Secondary | ICD-10-CM | POA: Insufficient documentation

## 2012-06-30 DIAGNOSIS — N83201 Unspecified ovarian cyst, right side: Secondary | ICD-10-CM | POA: Insufficient documentation

## 2012-06-30 DIAGNOSIS — D069 Carcinoma in situ of cervix, unspecified: Secondary | ICD-10-CM

## 2012-06-30 DIAGNOSIS — G4700 Insomnia, unspecified: Secondary | ICD-10-CM

## 2012-06-30 DIAGNOSIS — N83209 Unspecified ovarian cyst, unspecified side: Secondary | ICD-10-CM

## 2012-06-30 MED ORDER — ZOLPIDEM TARTRATE 10 MG PO TABS: 10.0000 mg | ORAL_TABLET | Freq: Every evening | ORAL | Status: DC | PRN

## 2012-06-30 NOTE — Patient Instructions (Addendum)
Quiste ovrico (Ovarian Cyst) Los ovarios son pequeos rganos que se encuentran a cada lado del tero. Los ovarios son los rganos que producen las hormonas femeninas, estrgeno y progesterona. Un quiste en el ovario es una bolsa llena de lquido que puede variar en tamao. Es normal que se formen pequeos quistes en las mujeres en edad de procrear y que an tienen sus perodos menstruales. Este tipo de quiste se denomina quiste folicular que se transforma en un quiste ovulatorio (quiste del cuerpo lteo) despus de producir los vulos. Si la mujer no queda embarazada, desaparece sin ninguna intervencin. Existen otros tipos de quistes de ovario que pueden causar problemas y necesitan ser tratados. El problema ms grave es que el quiste sea canceroso. Debe advertirse que en las mujeres menopusicas que presentan un quiste de ovario, existe un mayor riesgo de que ese quiste sea canceroso. Deben evaluarse muy rpida y cuidadosamente, y controlarse adecuadamente. Esto es ms importante en las mujeres menopusicas debido al elevado porcentaje de cncer de ovario durante este perodo. CAUSAS Y TIPOS DE CNCER DE OVARIO:  QUISTE FUNCIONAL: El quiste de folculo o cuerpo lteo es un quiste funcional que aparece todos los meses durante la ovulacin, con el ciclo menstrual. Si la mujer no queda embarazada, desaparecen con el prximo ciclo menstrual. Generalmente los quistes funcionales no presentan sntomas.  ENDOMETRIOMA: este quiste aparece en la superficie del tejido del tero. Un quiste se forma en el interior o sobre el ovario. Cada mes se desarrolla un poco ms debido a la sangre del perodo menstrual. Tambin se denomina "quiste de chocolate" debido a que est lleno de sangre que se vuelve color marrn. Este tipo de quiste causa dolor en la zona inferior del abdomen durante las relaciones sexuales y durante el perodo menstrual.  CISTADENOMA: Se desarrolla a partir de las clulas externas del ovario.  Generalmente no son cancerosos. Pueden llegar a ser de gran tamao y causar dolor en la zona baja del abdomen y durante las relaciones sexuales. Este tipo de quiste puede retorcerse e interrumpir el flujo de sangre, lo que causa un dolor muy intenso. Tambin puede romperse y causar mucho dolor.  QUISTE DERMOIDE: generalmente este tipo de quiste aparece en ambos ovarios. Puede haber diferentes tipos de tejidos en el quiste. Por ejemplo tejidos de piel, dientes, pelos o cartlago. En general no dan sntomas, excepto que sean muy grandes. Los quistes dermoides rara vez son cancerosos.  OVARIO POLIQUSTICO: es una enfermedad rara relacionada con trastornos hormonales que produce muchos quistes pequeos en ambos ovarios. Estos quistes son similares a los quistes de folculo pero nunca producen vulos y se transforman en cuerpo lteo. Pueden causar aumento del peso corporal, infertilidad, acn, aumento del vello facial y corporal y falta de perodos menstruales o perodos anormales. Muchas mujeres que sufren este problema presentan diabetes tipo 2. La causa exacta de este problema es desconocida. Un ovario poliqustico rara vez es canceroso.  QUISTE OVRICO TECALUTESTICO Aparece cuando hay demasiada hormona (gonadotrofina corinica humana), la que sobreestimula al ovario para producir vulos. Se observan con frecuencia cuando el mdico estimula los ovarios para la fertilizacin in vitro (bebs de probeta).  QUISTE LUTENICO: Aparece durante el embarazo. En algunos casos raros, produce una obstruccin del canal de parto. Generalmente desaparece despus del parto. SNTOMAS  Dolor o molestias en la pelvis.  Dolor durante las relaciones sexuales.  Aumento de la inflamacin en el abdomen.  Perodos menstruales anormales.  Aumento del dolor en los perodos menstruales.  Deja de menstruar   y no est embarazada. DIAGNSTICO El diagnstico puede realizarse durante:  Los exmenes plvicos anuales o de  rutina (frecuente).  Ecografas  Radiografas de la pelvis.  Tomografa computada  Resonancia magntica..  Anlisis de sangre. TRATAMIENTO  El tratamiento slo consiste en que el mdico controle el quiste mensualmente, durante 2  3 meses. Muchos desaparecen espontnemente, especialmente los quistes funcionales.  Puede aspirarse (secarse) con una aguja larga observndolo en una ecografa, o por laparoscopa (insertando un tubo en la pelvis a travs de una pequea incisin).  El quiste puede extirparse con laparoscopa.  En algunos casos es necesario extirparlo a travs de una incisin en la zona inferior del abdomen.  El tratamiento hormonal se utiliza para disolver ciertos tipos de quiste.  Las pldoras anticonceptivas pueden utilizarse para disolver otros tipos. INSTRUCCIONES PARA EL CUIDADO DOMICILIARIO Siga las indicaciones del profesional con respecto a:  Medicamentos  Visitas de control para evaluar y tratar el quiste.  Puede ser necesario que tenga que volver o concertar una cita con otro profesional para descubrir la causa exacta del quiste, si su mdico no es gineclogo.  Realice un examen plvico y un Papanicolau todos los aos, segn las indicaciones.  Informe al mdico si tubo un quiste de ovario en el pasado. SOLICITE ATENCIN MDICA SI:  Los perodos se atrasan, son irregulares, le faltan o son dolorosos.  El dolor abdominal (en el vientre) o en la pelvis persisten.  El abdomen se agranda o se hincha.  Siente una opresin en la vejiga o tiene problemas para vaciarla completamente.  Tiene dolor durante las relaciones sexuales.  Tiene la sensacin de hinchazn, presin o molestias en el abdomen.  Pierde peso sin razn aparente.  Siente un malestar generalizado.  Est constipada.  Pierde el apetito.  Aparece acn.  Aumenta el vello facial y corporal.  Aumenta de peso sin hacer modificaciones en su actividad fsica y en su dieta  habitual.  Sospecha que est embarazada. SOLICITE ATENCIN MDICA DE INMEDIATO SI:  Siente dolor abdominal cada vez ms intenso.  Si tiene ganas de vomitar (nuseas).  Le sube repentinamente la fiebre.  Siente dolor abdominal al mover el intestino.  Sus perodos menstruales son ms abundantes que lo habitual. Document Released: 03/17/2005 Document Revised: 08/30/2011 [employer] Patient Information 2013 [employer].  

## 2012-06-30 NOTE — Progress Notes (Signed)
Patient presented to the office today for followup Pap smear. Review of her record indicated her dysplasia history as follows:  Review of her record indicated that in 2012 her Pap smear had demonstrated atypical squamous cells of undetermined significance but no high-risk HPV was noted and she had a negative colposcopy and biopsy.   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  She then had a cervical LEEP procedure January 04, 2012 with the following result:  Diagnosis 1. Cervix, LEEP, cervical button - BENIGN ENDOCERVIX. - NO DYSPLASIA. 2. Cervix, LEEP - SEVERE SQUAMOUS DYSPLASIA, CIN-III INVOLVING THE ENDOCERVICAL MARGIN. - ECTOCERVICAL MARGIN NOT INVOLVED.  Patient had a Mirena IUD placed 03/10/2012.  Pap smear was done today with a vigorous Cytobrush. The IUD string was not seen so an ultrasound was done today to confirm position of the IUD. Ultrasound report as follows:  Uterus measures 7.6 x 4.4 x 3.1 cm with endometrial stripe of 2.9 mm. IUD string was seen in the endometrial cavity with with string in the cervix. Left ovary appears normal  Ovarian echo-free thinwall avascular cyst which measured 41 x 35 x 34 mm. No free fluid in the cul-de-sac.  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 Patient will return back in 6 months for followup colposcopy. The incidental finding of the right ovarian echo-free cyst with dimensions as described above will be followed up with an ultrasound in 3 months. Literature information was provided in Spanish and  will follow accordingly. Prescription refill for Ambien for her insomnia was provided. She's been doing well with her Paxil for anxiety and depression.

## 2012-09-07 ENCOUNTER — Other Ambulatory Visit: Payer: Self-pay | Admitting: *Deleted

## 2012-09-07 DIAGNOSIS — G4700 Insomnia, unspecified: Secondary | ICD-10-CM

## 2012-09-08 MED ORDER — ZOLPIDEM TARTRATE 10 MG PO TABS: 10.0000 mg | ORAL_TABLET | Freq: Every evening | ORAL | Status: DC | PRN

## 2012-09-08 NOTE — Telephone Encounter (Signed)
Rx called in KW 

## 2012-09-29 ENCOUNTER — Ambulatory Visit: Payer: Self-pay | Admitting: Gynecology

## 2012-09-29 ENCOUNTER — Other Ambulatory Visit: Payer: 59

## 2012-10-05 ENCOUNTER — Other Ambulatory Visit: Payer: 59

## 2012-10-05 ENCOUNTER — Ambulatory Visit: Payer: 59 | Admitting: Gynecology

## 2012-10-16 ENCOUNTER — Other Ambulatory Visit: Payer: Self-pay | Admitting: Gynecology

## 2012-10-16 DIAGNOSIS — N83209 Unspecified ovarian cyst, unspecified side: Secondary | ICD-10-CM

## 2012-10-16 DIAGNOSIS — Z30431 Encounter for routine checking of intrauterine contraceptive device: Secondary | ICD-10-CM

## 2012-10-27 ENCOUNTER — Ambulatory Visit (INDEPENDENT_AMBULATORY_CARE_PROVIDER_SITE_OTHER): Payer: 59

## 2012-10-27 ENCOUNTER — Ambulatory Visit (INDEPENDENT_AMBULATORY_CARE_PROVIDER_SITE_OTHER): Payer: 59 | Admitting: Gynecology

## 2012-10-27 ENCOUNTER — Encounter: Payer: Self-pay | Admitting: Gynecology

## 2012-10-27 DIAGNOSIS — N83201 Unspecified ovarian cyst, right side: Secondary | ICD-10-CM

## 2012-10-27 DIAGNOSIS — N83209 Unspecified ovarian cyst, unspecified side: Secondary | ICD-10-CM

## 2012-10-27 DIAGNOSIS — IMO0002 Reserved for concepts with insufficient information to code with codable children: Secondary | ICD-10-CM

## 2012-10-27 DIAGNOSIS — Z30431 Encounter for routine checking of intrauterine contraceptive device: Secondary | ICD-10-CM

## 2012-10-27 DIAGNOSIS — R87613 High grade squamous intraepithelial lesion on cytologic smear of cervix (HGSIL): Secondary | ICD-10-CM

## 2012-10-27 DIAGNOSIS — Z113 Encounter for screening for infections with a predominantly sexual mode of transmission: Secondary | ICD-10-CM

## 2012-10-27 NOTE — Progress Notes (Signed)
Patient presented to the office today to discuss her ultrasound. She was seen the office on January 10 for followup Pap smear because of the following history:  Review of her record indicated her dysplasia history as follows:  Review of her record indicated that in 2012 her Pap smear had demonstrated atypical squamous cells of undetermined significance but no high-risk HPV was noted and she had a negative colposcopy and biopsy.  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  She then had a cervical LEEP procedure January 04, 2012 with the following result:  Diagnosis  1. Cervix, LEEP, cervical button  - BENIGN ENDOCERVIX.  - NO DYSPLASIA.  2. Cervix, LEEP  - SEVERE SQUAMOUS DYSPLASIA, CIN-III INVOLVING THE ENDOCERVICAL MARGIN.  - ECTOCERVICAL MARGIN NOT INVOLVED.  Patient had a Mirena IUD placed 03/10/2012.  Pap smear January 2014: Ascus no high-risk HPV noted. An ultrasound had been done because the IUD string was not seen in the ultrasound demonstrated the IUD in the proper place but that there was a 4.1 x 3.5 x 3.4 cm avascular thinwall echo-free cyst of the right ovary. She presented today for followup ultrasound with the following findings:  Uterus measures 6.7 x 4.3 x 2.9 cm with endometrial stripe of 3.3 mm. Left ovary was normal. IUD was seen in the endometrial cavity. A right ovarian echo-free thinwall avascular cyst with a thick septum measuring 25 x 30 x 50 mm no free fluid seen. The cyst is decreased in size from previous scan.  Assessment/plan: Right ovarian cyst decreased half of its size from previous scan. She will return back in September for followup ultrasound. Due to her past history of dysplasia she scheduled to return back in July for colposcopy and Pap smear. Patient today was requesting an STD screening so an HIV, RPR, hepatitis B and C. Was drawn today as  well.

## 2012-10-28 LAB — HEPATITIS B SURFACE ANTIGEN: Hepatitis B Surface Ag: NEGATIVE

## 2012-10-28 LAB — RPR

## 2012-12-29 ENCOUNTER — Ambulatory Visit: Payer: 59 | Admitting: Gynecology

## 2013-01-05 ENCOUNTER — Ambulatory Visit: Payer: Self-pay | Admitting: Gynecology

## 2013-01-26 ENCOUNTER — Ambulatory Visit: Payer: 59 | Admitting: Gynecology

## 2013-01-26 ENCOUNTER — Other Ambulatory Visit: Payer: 59

## 2013-03-28 ENCOUNTER — Encounter: Payer: 59 | Admitting: Gynecology

## 2013-03-28 ENCOUNTER — Ambulatory Visit (INDEPENDENT_AMBULATORY_CARE_PROVIDER_SITE_OTHER): Payer: 59 | Admitting: Women's Health

## 2013-03-28 ENCOUNTER — Encounter: Payer: Self-pay | Admitting: Women's Health

## 2013-03-28 ENCOUNTER — Other Ambulatory Visit (HOSPITAL_COMMUNITY)
Admission: RE | Admit: 2013-03-28 | Discharge: 2013-03-28 | Disposition: A | Discharge status: Home / Self Care | Payer: 59 | Source: Ambulatory Visit | Attending: Gynecology | Admitting: Gynecology

## 2013-03-28 VITALS — BP 128/70 | Ht 59.5 in | Wt 134.6 lb

## 2013-03-28 DIAGNOSIS — Z01419 Encounter for gynecological examination (general) (routine) without abnormal findings: Secondary | ICD-10-CM

## 2013-03-28 DIAGNOSIS — Z833 Family history of diabetes mellitus: Secondary | ICD-10-CM

## 2013-03-28 DIAGNOSIS — Z23 Encounter for immunization: Secondary | ICD-10-CM

## 2013-03-28 DIAGNOSIS — N898 Other specified noninflammatory disorders of vagina: Secondary | ICD-10-CM

## 2013-03-28 DIAGNOSIS — F411 Generalized anxiety disorder: Secondary | ICD-10-CM

## 2013-03-28 DIAGNOSIS — N949 Unspecified condition associated with female genital organs and menstrual cycle: Secondary | ICD-10-CM

## 2013-03-28 DIAGNOSIS — Z113 Encounter for screening for infections with a predominantly sexual mode of transmission: Secondary | ICD-10-CM

## 2013-03-28 LAB — CBC WITH DIFFERENTIAL/PLATELET
Eosinophils Relative: 3 % (ref 0–5)
HCT: 40.6 % (ref 36.0–46.0)
Hemoglobin: 13.9 g/dL (ref 12.0–15.0)
Lymphocytes Relative: 44 % (ref 12–46)
Lymphs Abs: 3.2 10*3/uL (ref 0.7–4.0)
MCV: 88.8 fL (ref 78.0–100.0)
Monocytes Relative: 7 % (ref 3–12)
Platelets: 272 10*3/uL (ref 150–400)
RBC: 4.57 MIL/uL (ref 3.87–5.11)
WBC: 7.2 10*3/uL (ref 4.0–10.5)

## 2013-03-28 LAB — WET PREP FOR TRICH, YEAST, CLUE: Trich, Wet Prep: NONE SEEN

## 2013-03-28 MED ORDER — ALPRAZOLAM 0.25 MG PO TABS: 0.2500 mg | ORAL_TABLET | Freq: Every evening | ORAL | Status: DC | PRN

## 2013-03-28 NOTE — Progress Notes (Signed)
Stacy Romero 22-Oct-1980 [id number]    History:    The patient presents for annual exam.  Amenorrhea Mirena IUD placed 04/2011. CIN-3 with LEEP cone 12/2011 with Pap January 2014 ascus with negative HR HPV. States has occasional vaginal odor.  Past medical history, past surgical history, family history and social history were all reviewed and documented in the EPIC chart. From [place], 31 year old daughter Judeth Cornfield lives in [place].  Father diabetes and hypertension.  ROS:  A  ROS was performed and pertinent positives and negatives are included in the history.  Exam:  Filed Vitals:   03/28/13 1432  BP: 128/70    General appearance:  Normal Head/Neck:  Normal, without cervical or supraclavicular adenopathy. Thyroid:  Symmetrical, normal in size, without palpable masses or nodularity. Respiratory  Effort:  Normal  Auscultation:  Clear without wheezing or rhonchi Cardiovascular  Auscultation:  Regular rate, without rubs, murmurs or gallops  Edema/varicosities:  Not grossly evident Abdominal  Soft,nontender, without masses, guarding or rebound.  Liver/spleen:  No organomegaly noted  Hernia:  None appreciated  Skin  Inspection:  Grossly normal  Palpation:  Grossly normal Neurologic/psychiatric  Orientation:  Normal with appropriate conversation.  Mood/affect:  Normal  Genitourinary    Breasts: Examined lying and sitting.     Right: Without masses, retractions, discharge or axillary adenopathy.     Left: Without masses, retractions, discharge or axillary adenopathy.   Inguinal/mons:  Normal without inguinal adenopathy  External genitalia:  Normal  BUS/Urethra/Skene's glands:  Normal  Bladder:  Normal  Vagina:  Normal  Cervix:  Normal IUD strings visible, wet prep negative  Uterus:   normal in size, shape and contour.  Midline and mobile  Adnexa/parametria:     Rt: Without masses or tenderness.   Lt: Without masses or tenderness.  Anus and perineum: Normal  Digital  rectal exam: Normal sphincter tone without palpated masses or tenderness  Assessment/Plan:  32 y.o. SHF G1P1 for annual exam.     LEEP cone for CIN-3 12/2011 Mirena IUD placed 04/2011 Anxiety STD screen  Plan: Pap triage based on results, was supposed to return to office in June for colposcopy with Dr. Lily Peer., Instructed to schedule now. SBE's, exercise, calcium rich diet, MVI daily encouraged. Uses Xanax on rare occasion, aware of addictive properties. Xanax 0.25 at bedtime when necessary #30 with no refills prescription given. CBC, glucose, UA, Pap, GC/Chlamydia, HIV, hep B, C., RPRHarrington Challenger WHNP, 3:51 PM 03/28/2013

## 2013-03-28 NOTE — Patient Instructions (Signed)

## 2013-03-28 NOTE — Addendum Note (Signed)
Addended by: Richardson Chiquito on: 03/28/2013 04:10 PM   Modules accepted: Orders

## 2013-03-29 LAB — HEPATITIS B SURFACE ANTIGEN: Hepatitis B Surface Ag: NEGATIVE

## 2013-03-29 LAB — HEPATITIS C ANTIBODY: HCV Ab: NEGATIVE

## 2013-03-29 LAB — URINALYSIS W MICROSCOPIC + REFLEX CULTURE
Bilirubin Urine: NEGATIVE
Crystals: NONE SEEN
Nitrite: NEGATIVE
Specific Gravity, Urine: 1.011 (ref 1.005–1.030)
Urobilinogen, UA: 0.2 mg/dL (ref 0.0–1.0)
pH: 7 (ref 5.0–8.0)

## 2013-03-29 LAB — RPR

## 2013-03-29 LAB — GC/CHLAMYDIA PROBE AMP: CT Probe RNA: NEGATIVE

## 2013-06-05 ENCOUNTER — Other Ambulatory Visit: Payer: Self-pay

## 2013-06-05 DIAGNOSIS — F411 Generalized anxiety disorder: Secondary | ICD-10-CM

## 2013-06-05 MED ORDER — ALPRAZOLAM 0.25 MG PO TABS: 0.2500 mg | ORAL_TABLET | Freq: Every evening | ORAL | Status: DC | PRN

## 2013-06-05 NOTE — Telephone Encounter (Signed)
Called into pharmacy

## 2014-04-22 ENCOUNTER — Encounter: Payer: Self-pay | Admitting: Women's Health

## 2015-03-07 ENCOUNTER — Other Ambulatory Visit (HOSPITAL_COMMUNITY)
Admission: RE | Admit: 2015-03-07 | Discharge: 2015-03-07 | Disposition: A | Discharge status: Home / Self Care | Payer: 59 | Source: Ambulatory Visit | Attending: Gynecology | Admitting: Gynecology

## 2015-03-07 ENCOUNTER — Encounter: Payer: Self-pay | Admitting: Gynecology

## 2015-03-07 ENCOUNTER — Ambulatory Visit (INDEPENDENT_AMBULATORY_CARE_PROVIDER_SITE_OTHER): Payer: 59 | Admitting: Gynecology

## 2015-03-07 VITALS — BP 116/70 | Ht 60.0 in | Wt 129.8 lb

## 2015-03-07 DIAGNOSIS — Z01419 Encounter for gynecological examination (general) (routine) without abnormal findings: Secondary | ICD-10-CM | POA: Diagnosis not present

## 2015-03-07 DIAGNOSIS — Z113 Encounter for screening for infections with a predominantly sexual mode of transmission: Secondary | ICD-10-CM

## 2015-03-07 DIAGNOSIS — Z23 Encounter for immunization: Secondary | ICD-10-CM

## 2015-03-07 DIAGNOSIS — B9689 Other specified bacterial agents as the cause of diseases classified elsewhere: Secondary | ICD-10-CM

## 2015-03-07 DIAGNOSIS — Z124 Encounter for screening for malignant neoplasm of cervix: Secondary | ICD-10-CM

## 2015-03-07 DIAGNOSIS — Z1151 Encounter for screening for human papillomavirus (HPV): Secondary | ICD-10-CM | POA: Diagnosis not present

## 2015-03-07 DIAGNOSIS — N760 Acute vaginitis: Secondary | ICD-10-CM

## 2015-03-07 DIAGNOSIS — A499 Bacterial infection, unspecified: Secondary | ICD-10-CM | POA: Diagnosis not present

## 2015-03-07 DIAGNOSIS — N9489 Other specified conditions associated with female genital organs and menstrual cycle: Secondary | ICD-10-CM | POA: Diagnosis not present

## 2015-03-07 DIAGNOSIS — N898 Other specified noninflammatory disorders of vagina: Secondary | ICD-10-CM

## 2015-03-07 LAB — COMPREHENSIVE METABOLIC PANEL
ALBUMIN: 4.5 g/dL (ref 3.6–5.1)
ALK PHOS: 40 U/L (ref 33–115)
ALT: 17 U/L (ref 6–29)
AST: 16 U/L (ref 10–30)
BILIRUBIN TOTAL: 0.4 mg/dL (ref 0.2–1.2)
BUN: 10 mg/dL (ref 7–25)
CALCIUM: 9.7 mg/dL (ref 8.6–10.2)
CO2: 26 mmol/L (ref 20–31)
Chloride: 103 mmol/L (ref 98–110)
Creat: 0.51 mg/dL (ref 0.50–1.10)
Glucose, Bld: 71 mg/dL (ref 65–99)
Potassium: 3.7 mmol/L (ref 3.5–5.3)
Sodium: 139 mmol/L (ref 135–146)
TOTAL PROTEIN: 7.2 g/dL (ref 6.1–8.1)

## 2015-03-07 LAB — WET PREP FOR TRICH, YEAST, CLUE
Trich, Wet Prep: NONE SEEN
Yeast Wet Prep HPF POC: NONE SEEN

## 2015-03-07 LAB — CBC WITH DIFFERENTIAL/PLATELET
BASOS ABS: 0.1 10*3/uL (ref 0.0–0.1)
Basophils Relative: 1 % (ref 0–1)
Eosinophils Absolute: 0.2 10*3/uL (ref 0.0–0.7)
Eosinophils Relative: 3 % (ref 0–5)
HEMATOCRIT: 37.3 % (ref 36.0–46.0)
HEMOGLOBIN: 12.9 g/dL (ref 12.0–15.0)
LYMPHS PCT: 43 % (ref 12–46)
Lymphs Abs: 2.5 10*3/uL (ref 0.7–4.0)
MCH: 30.6 pg (ref 26.0–34.0)
MCHC: 34.6 g/dL (ref 30.0–36.0)
MCV: 88.4 fL (ref 78.0–100.0)
MPV: 9.3 fL (ref 8.6–12.4)
Monocytes Absolute: 0.3 10*3/uL (ref 0.1–1.0)
Monocytes Relative: 6 % (ref 3–12)
NEUTROS PCT: 47 % (ref 43–77)
Neutro Abs: 2.7 10*3/uL (ref 1.7–7.7)
Platelets: 293 10*3/uL (ref 150–400)
RBC: 4.22 MIL/uL (ref 3.87–5.11)
RDW: 13.2 % (ref 11.5–15.5)
WBC: 5.7 10*3/uL (ref 4.0–10.5)

## 2015-03-07 LAB — TSH: TSH: 0.762 u[IU]/mL (ref 0.350–4.500)

## 2015-03-07 LAB — LIPID PANEL
Cholesterol: 170 mg/dL (ref 125–200)
HDL: 43 mg/dL — AB (ref >=46)
LDL Cholesterol: 101 mg/dL (ref <130)
TRIGLYCERIDES: 128 mg/dL (ref <150)
Total CHOL/HDL Ratio: 4 Ratio (ref <=5.0)
VLDL: 26 mg/dL (ref <30)

## 2015-03-07 MED ORDER — TINIDAZOLE 500 MG PO TABS
ORAL_TABLET | ORAL | Status: DC
Start: 2015-03-07 — End: 2017-04-08

## 2015-03-07 NOTE — Progress Notes (Signed)
Stacy Romero 05-22-1981 [id number]   History:    34 y.o.  for annual gyn exam  Who has not been seen the office for 2 years. Patient had a Mirena IUD placed in November 2012. 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 States has occasional vaginal odor. Patient is now living in [place]. Patient would like to have an STD screen.  Past medical history,surgical history, family history and social history were all reviewed and documented in the EPIC chart.  Gynecologic History No LMP recorded. Patient is not currently having periods (Reason: IUD). Contraception: IUD Last Pap: See above. Results were: See above Last mammogram: Not indicated. Results were: Not indicated  Obstetric History OB History  Gravida Para Term Preterm AB SAB TAB Ectopic Multiple Living  # Outcome Date GA Lbr Len/2nd Weight Sex Delivery Anes PTL Lv  1 Term     F Vag-Spont  N Y       ROS: A ROS was performed and pertinent positives and negatives are included in the history.  GENERAL: No fevers or chills. HEENT: No change in vision, no earache, sore throat or sinus congestion. NECK: No pain or stiffness. CARDIOVASCULAR: No chest pain or pressure. No palpitations. PULMONARY: No shortness of breath, cough or wheeze. GASTROINTESTINAL: No abdominal pain, nausea, vomiting or diarrhea, melena or bright red blood per rectum. GENITOURINARY: No urinary frequency, urgency, hesitancy or dysuria. MUSCULOSKELETAL: No joint or muscle pain, no back pain, no recent trauma. DERMATOLOGIC: No rash, no itching, no lesions. ENDOCRINE: No polyuria, polydipsia, no heat or cold intolerance. No recent change in weight. HEMATOLOGICAL: No anemia or easy bruising or bleeding. NEUROLOGIC: No headache, seizures, numbness, tingling or weakness. PSYCHIATRIC: No depression, no loss of interest in normal activity or change in sleep pattern.     Exam: chaperone  present  BP 116/70 mmHg  Ht 5' (1.524 m)  Wt 129 lb 12.8 oz (58.877 kg)  BMI 25.35 kg/m2  Body mass index is 25.35 kg/(m^2).  General appearance : Well developed well nourished female. No acute distress HEENT: Eyes: no retinal hemorrhage or exudates,  Neck supple, trachea midline, no carotid bruits, no thyroidmegaly Lungs: Clear to auscultation, no rhonchi or wheezes, or rib retractions  Heart: Regular rate and rhythm, no murmurs or gallops Breast:Examined in sitting and supine position were symmetrical in appearance, no palpable masses or tenderness,  no skin retraction, no nipple inversion, no nipple discharge, no skin discoloration, no axillary or supraclavicular lymphadenopathy Abdomen: no palpable masses or tenderness, no rebound or guarding Extremities: no edema or skin discoloration or tenderness  Pelvic:  Bartholin, Urethra, Skene Glands: Within normal limits             Vagina: No gross lesions or discharge  Cervix: No gross lesions or discharge  Uterus  anteverted, normal size, shape and consistency, non-tender and mobile  Adnexa  Without masses or tenderness  Anus and perineum  normal   Rectovaginal  normal sphincter tone without palpated masses or tenderness             Hemoccult not indicated   Wet prep: Positive amine, moderate clue cell, few WBC, too numerous to count bacteria  Assessment/Plan:  34 y.o. female for annual exam with clinical evidence of bacterial vaginosis will be treated with Tindamax 500 mg tablets. She will take 4 tablets today  repeat in 24 hours. A GC and Chlamydia culture was obtained as well as the following blood work to complete the STD screening: HIV, RPR, hepatitis B and C. As part of her screening fasting blood work the following was ordered: Comprehensive metabolic panel, lipid profile, TSH, CBC, and urinalysis. Pap with HPV screening was obtained today. Patient received the flu vaccine today.   Ok Edwards MD, 3:48 PM 03/07/2015

## 2015-03-07 NOTE — Patient Instructions (Signed)
Tinidazole tablets What is this medicine? TINIDAZOLE (tye NI da zole) is an antiinfective. It is used to treat amebiasis, giardiasis, trichomoniasis, and vaginosis. It will not work for colds, flu, or other viral infections. This medicine may be used for other purposes; ask your health care provider or pharmacist if you have questions. COMMON BRAND NAME(S): Tindamax What should I tell my health care provider before I take this medicine? They need to know if you have any of these conditions: -anemia or other blood disorders -if you frequently drink alcohol containing drinks -receiving hemodialysis -seizure disorder -an unusual or allergic reaction to tinidazole, other medicines, foods, dyes, or preservatives -pregnant or trying to get pregnant -breast-feeding How should I use this medicine? Take this medicine by mouth with a full glass of water. Follow the directions on the prescription label. Take with food. Take your medicine at regular intervals. Do not take your medicine more often than directed. Take all of your medicine as directed even if you think you are better. Do not skip doses or stop your medicine early. Talk to your pediatrician regarding the use of this medicine in children. While this drug may be prescribed for children as young as 3 years of age for selected conditions, precautions do apply. Overdosage: If you think you have taken too much of this medicine contact a poison control center or emergency room at once. NOTE: This medicine is only for you. Do not share this medicine with others. What if I miss a dose? If you miss a dose, take it as soon as you can. If it is almost time for your next dose, take only that dose. Do not take double or extra doses. What may interact with this medicine? Do not take this medicine with any of the following medications: -alcohol or any product that contains alcohol -amprenavir oral solution -disulfiram -paclitaxel injection -ritonavir  oral solution -sertraline oral solution -sulfamethoxazole-trimethoprim injection This medicine may also interact with the following medications: -cholestyramine -cimetidine -conivaptan -cyclosporin -fluorouracil -fosphenytoin, phenytoin -ketoconazole -lithium -phenobarbital -tacrolimus -warfarin This list may not describe all possible interactions. Give your health care provider a list of all the medicines, herbs, non-prescription drugs, or dietary supplements you use. Also tell them if you smoke, drink alcohol, or use illegal drugs. Some items may interact with your medicine. What should I watch for while using this medicine? Tell your doctor or health care professional if your symptoms do not improve or if they get worse. Avoid alcoholic drinks while you are taking this medicine and for three days afterward. Alcohol may make you feel dizzy, sick, or flushed. If you are being treated for a sexually transmitted disease, avoid sexual contact until you have finished your treatment. Your sexual partner may also need treatment. What side effects may I notice from receiving this medicine? Side effects that you should report to your doctor or health care professional as soon as possible: -allergic reactions like skin rash, itching or hives, swelling of the face, lips, or tongue -breathing problems -confusion, depression -dark or white patches in the mouth -feeling faint or lightheaded, falls -fever, infection -numbness, tingling, pain or weakness in the hands or feet -pain when passing urine -seizures -unusually weak or tired -vaginal irritation or discharge -vomiting Side effects that usually do not require medical attention (report to your doctor or health care professional if they continue or are bothersome): -dark brown or reddish urine -diarrhea -headache -loss of appetite -metallic taste -nausea -stomach upset This list may not describe all   possible side effects. Call your  doctor for medical advice about side effects. You may report side effects to FDA at 1-800-FDA-1088. Where should I keep my medicine? Keep out of the reach of children. Store at room temperature between 15 and 30 degrees C (59 and 86 degrees F). Protect from light and moisture. Keep container tightly closed. Throw away any unused medicine after the expiration date. NOTE: This sheet is a summary. It may not cover all possible information. If you have questions about this medicine, talk to your doctor, pharmacist, or health care provider.  2015, Elsevier/Gold Standard. (2008-03-04 15:22:28) Bacterial Vaginosis Bacterial vaginosis is a vaginal infection that occurs when the normal balance of bacteria in the vagina is disrupted. It results from an overgrowth of certain bacteria. This is the most common vaginal infection in women of childbearing age. Treatment is important to prevent complications, especially in pregnant women, as it can cause a premature delivery. CAUSES  Bacterial vaginosis is caused by an increase in harmful bacteria that are normally present in smaller amounts in the vagina. Several different kinds of bacteria can cause bacterial vaginosis. However, the reason that the condition develops is not fully understood. RISK FACTORS Certain activities or behaviors can put you at an increased risk of developing bacterial vaginosis, including:  Having a new sex partner or multiple sex partners.  Douching.  Using an intrauterine device (IUD) for contraception. Women do not get bacterial vaginosis from toilet seats, bedding, swimming pools, or contact with objects around them. SIGNS AND SYMPTOMS  Some women with bacterial vaginosis have no signs or symptoms. Common symptoms include:  Grey vaginal discharge.  A fishlike odor with discharge, especially after sexual intercourse.  Itching or burning of the vagina and vulva.  Burning or pain with urination. DIAGNOSIS  Your health care  provider will take a medical history and examine the vagina for signs of bacterial vaginosis. A sample of vaginal fluid may be taken. Your health care provider will look at this sample under a microscope to check for bacteria and abnormal cells. A vaginal pH test may also be done.  TREATMENT  Bacterial vaginosis may be treated with antibiotic medicines. These may be given in the form of a pill or a vaginal cream. A second round of antibiotics may be prescribed if the condition comes back after treatment.  HOME CARE INSTRUCTIONS   Only take over-the-counter or prescription medicines as directed by your health care provider.  If antibiotic medicine was prescribed, take it as directed. Make sure you finish it even if you start to feel better.  Do not have sex until treatment is completed.  Tell all sexual partners that you have a vaginal infection. They should see their health care provider and be treated if they have problems, such as a mild rash or itching.  Practice safe sex by using condoms and only having one sex partner. SEEK MEDICAL CARE IF:   Your symptoms are not improving after 3 days of treatment.  You have increased discharge or pain.  You have a fever. MAKE SURE YOU:   Understand these instructions.  Will watch your condition.  Will get help right away if you are not doing well or get worse. FOR MORE INFORMATION  Centers for Disease Control and Prevention, Division of STD Prevention: www.cdc.gov/std American Sexual Health Association (ASHA): www.ashastd.org  Document Released: 06/07/2005 Document Revised: 03/28/2013 Document Reviewed: 01/17/2013 ExitCare Patient Information 2015 [employer]. This information is not intended to replace advice given to you   by your health care provider. Make sure you discuss any questions you have with your health care provider.  

## 2015-03-08 LAB — URINALYSIS W MICROSCOPIC + REFLEX CULTURE
BILIRUBIN URINE: NEGATIVE
Casts: NONE SEEN [LPF]
Crystals: NONE SEEN [HPF]
GLUCOSE, UA: NEGATIVE
Hgb urine dipstick: NEGATIVE
Ketones, ur: NEGATIVE
LEUKOCYTES UA: NEGATIVE
NITRITE: NEGATIVE
Protein, ur: NEGATIVE
RBC / HPF: NONE SEEN RBC/HPF (ref <=2)
SPECIFIC GRAVITY, URINE: 1.019 (ref 1.001–1.035)
YEAST: NONE SEEN [HPF]
pH: 6 (ref 5.0–8.0)

## 2015-03-08 LAB — GC/CHLAMYDIA PROBE AMP
CT PROBE, AMP APTIMA: NEGATIVE
GC Probe RNA: NEGATIVE

## 2015-03-08 LAB — HEPATITIS B SURFACE ANTIGEN: Hepatitis B Surface Ag: NEGATIVE

## 2015-03-08 LAB — HIV ANTIBODY (ROUTINE TESTING W REFLEX): HIV: NONREACTIVE

## 2015-03-08 LAB — HEPATITIS C ANTIBODY: HCV Ab: NEGATIVE

## 2015-03-08 LAB — RPR

## 2015-03-10 ENCOUNTER — Other Ambulatory Visit: Payer: Self-pay | Admitting: Gynecology

## 2015-03-10 LAB — URINE CULTURE

## 2015-03-10 MED ORDER — NITROFURANTOIN MONOHYD MACRO 100 MG PO CAPS: 100.0000 mg | ORAL_CAPSULE | Freq: Two times a day (BID) | ORAL | Status: DC

## 2015-03-11 LAB — CYTOLOGY - PAP

## 2015-09-26 ENCOUNTER — Ambulatory Visit: Payer: 59 | Admitting: Women's Health

## 2015-10-07 ENCOUNTER — Ambulatory Visit (INDEPENDENT_AMBULATORY_CARE_PROVIDER_SITE_OTHER): Payer: 59 | Admitting: Gynecology

## 2015-10-07 ENCOUNTER — Encounter: Payer: Self-pay | Admitting: Gynecology

## 2015-10-07 VITALS — BP 112/78 | Wt 116.0 lb

## 2015-10-07 DIAGNOSIS — Z113 Encounter for screening for infections with a predominantly sexual mode of transmission: Secondary | ICD-10-CM | POA: Diagnosis not present

## 2015-10-07 DIAGNOSIS — K5900 Constipation, unspecified: Secondary | ICD-10-CM | POA: Diagnosis not present

## 2015-10-07 DIAGNOSIS — M6248 Contracture of muscle, other site: Secondary | ICD-10-CM | POA: Diagnosis not present

## 2015-10-07 DIAGNOSIS — M62838 Other muscle spasm: Secondary | ICD-10-CM

## 2015-10-07 NOTE — Progress Notes (Signed)
   Patient is a 35 year old presented to the office today with several complaints. Her main concern is she is wanting to be tested for possible STD since she believes her partner may of been unfaithful. She has no vaginal discharge. Her lipid complaint is para clear after work for long appears that time she gets these neck spasms per reports no radiculopathy. Her third complaint is that she has constipation whereby she has a bowel movement every 3 days and has to strain. Patient had a Mirena IUD placed in March 2013 and reports very light if any menstrual cycles.  Exam: Neck: Full range of motion no point tenderness Pelvic: Bartholin urethra Skene was within normal limits Vagina: No lesions or discharge Cervix: No lesions or discharge IUD string not seen Bimanual exam: Not done Rectal exam: Not done  Assessment/plan: For her spasms in her neck she can take ibuprofen over-the-counter when necessary. For constipation she can increase her fluid intake along with high fiber diet and take Colace every day or every other day. For STD screening GC, Chlamydia culture, HIV, RPR and hepatitis B and C was obtained today result pending at time of this dictation. Patient scheduled to return to the office in September for her annual exam will do an ultrasound again to compare with 2014 to reassure the IUD has not changed.

## 2015-10-08 LAB — HIV ANTIBODY (ROUTINE TESTING W REFLEX): HIV 1&2 Ab, 4th Generation: NONREACTIVE

## 2015-10-08 LAB — HEPATITIS B SURFACE ANTIGEN: HEP B S AG: NEGATIVE

## 2015-10-08 LAB — RPR

## 2015-10-08 LAB — HEPATITIS C ANTIBODY: HCV Ab: NEGATIVE

## 2015-10-08 LAB — GC/CHLAMYDIA PROBE AMP
CT PROBE, AMP APTIMA: NOT DETECTED
GC PROBE AMP APTIMA: NOT DETECTED

## 2015-12-04 ENCOUNTER — Other Ambulatory Visit: Payer: Self-pay | Admitting: Gynecology

## 2015-12-04 DIAGNOSIS — Z30431 Encounter for routine checking of intrauterine contraceptive device: Secondary | ICD-10-CM

## 2016-01-26 ENCOUNTER — Encounter: Payer: Self-pay | Admitting: Gynecology

## 2016-01-26 ENCOUNTER — Ambulatory Visit (INDEPENDENT_AMBULATORY_CARE_PROVIDER_SITE_OTHER): Payer: 59

## 2016-01-26 ENCOUNTER — Other Ambulatory Visit: Payer: Self-pay | Admitting: Gynecology

## 2016-01-26 ENCOUNTER — Ambulatory Visit (INDEPENDENT_AMBULATORY_CARE_PROVIDER_SITE_OTHER): Payer: 59 | Admitting: Gynecology

## 2016-01-26 VITALS — BP 126/72

## 2016-01-26 DIAGNOSIS — N83201 Unspecified ovarian cyst, right side: Secondary | ICD-10-CM | POA: Insufficient documentation

## 2016-01-26 DIAGNOSIS — Z30431 Encounter for routine checking of intrauterine contraceptive device: Secondary | ICD-10-CM | POA: Diagnosis not present

## 2016-01-26 DIAGNOSIS — R102 Pelvic and perineal pain: Secondary | ICD-10-CM

## 2016-01-26 DIAGNOSIS — N83202 Unspecified ovarian cyst, left side: Secondary | ICD-10-CM | POA: Diagnosis not present

## 2016-01-26 MED ORDER — MEDROXYPROGESTERONE ACETATE 150 MG/ML IM SUSP
150.0000 mg | Freq: Once | INTRAMUSCULAR | Status: AC
  Administered 2016-01-26: 150 mg via INTRAMUSCULAR

## 2016-01-26 NOTE — Patient Instructions (Signed)
Marcador tumoral CA-125 (CA-125 Tumor Marker Test) POR QU ME DEBO REALIZAR ESTA PRUEBA? Esta prueba tiene como fin verificar el nivel del antgeno del cncer125 (CA-125) en la sangre. La prueba de marcador tumoral CA-125 puede ser til para detectar el cncer de ovario y se realiza solamente si se considera que la mujer corre un alto riesgo de presentar este tipo de cncer. El mdico puede recomendarle esta prueba si:  Usted tiene importantes antecedentes familiares de cncer de ovario.  Usted tiene un defecto gentico en el antgeno del cncer de mama (BRCA, por sus siglas en ingls). Si ya le han diagnosticado cncer de ovario, el mdico puede hacerle esta prueba como ayuda para identificar el grado de la enfermedad y controlar cmo responde al tratamiento. QU TIPO DE MUESTRA SE TOMA? Para esta prueba, se extrae una muestra de sangre. Por lo general, para extraerla, se introduce una aguja en una vena. CMO DEBO PREPARARME PARA ESTA PRUEBA? No se requiere una preparacin para esta prueba. QU SON LOS VALORES DE REFERENCIA? Los valores de referencia son los valores saludables establecidos despus de realizarle el anlisis a un grupo grande de personas sanas. Pueden variar entre diferentes personas, laboratorios y hospitales. Es su responsabilidad retirar el resultado del estudio. Consulte en el laboratorio o en el departamento en el que fue realizado el estudio cundo y cmo podr obtener los resultados. El valor de referencia para esta prueba es de 0a 35unidades/ml o menos de 35unidades/l (unidades SI). QU SIGNIFICAN LOS RESULTADOS? Los niveles ms altos de CA-125 pueden indicar lo siguiente:  Ciertos tipos de cncer, entre ellos:  Cncer de ovario.  Cncer de pncreas.  Cncer de colon.  Cncer de pulmn.  Cncer de mama.  Linfomas.  Trastornos no cancerosos (benignos), entre ellos:  Cirrosis.  Embarazo.  Endometriosis.  Pancreatitis.  Enfermedad plvica  inflamatoria (EPI). Hable con el mdico sobre los resultados, las opciones de tratamiento y, si es necesario, la necesidad de realizar ms estudios. Hable con el mdico si tiene alguna pregunta sobre los resultados.   Esta informacin no tiene como fin reemplazar el consejo del mdico. Asegrese de hacerle al mdico cualquier pregunta que tenga.   Document Released: 03/28/2013 Document Revised: 06/28/2014 Elsevier Interactive Patient Education 2016 Elsevier Inc. Quiste ovrico (Ovarian Cyst) Un quiste ovrico es una bolsa llena de lquido que se forma en el ovario. Los ovarios son los rganos pequeos que producen vulos en las mujeres. Se pueden formar varios tipos de quistes en los ovarios. La mayora no son cancerosos. Muchos de ellos no causan problemas y con frecuencia desaparecen solos. Algunos pueden provocar sntomas y requerir tratamiento. Los tipos ms comunes de quistes ovricos son los siguientes:  Quistes funcionales: estos quistes pueden aparecer todos los meses durante el ciclo menstrual. Esto es normal. Estos quistes suelen desaparecer con el prximo ciclo menstrual si la mujer no queda embarazada. En general, los quistes funcionales no tienen sntomas.  Endometriomas: estos quistes se forman a partir del tejido que recubre el tero. Tambin se denominan "quistes de chocolate" porque se llenan de sangre que se vuelve marrn. Este tipo de quiste puede provocar dolor en la zona inferior del abdomen durante la relacin sexual y con el perodo menstrual.  Cistoadenomas: este tipo se desarrolla a partir de las clulas que se ubican en el exterior del ovario. Estos quistes pueden ser muy grandes y causar dolor en la zona inferior del abdomen y durante la relacin sexual. Este tipo de quiste puede girar sobre s mismo, cortar   el suministro de sangre y causar un dolor intenso. Tambin se puede romper con facilidad y provocar mucho dolor.  Quistes dermoides: este tipo de quiste a veces se  encuentra en ambos ovarios. Estos quistes pueden contener diferentes tipos de tejidos del organismo, como piel, dientes, pelo o cartlago. Generalmente no tienen sntomas, a menos que sean muy grandes.  Quistes tecalutenicos: aparecen cuando se produce demasiada cantidad de cierta hormona (gonadotropina corinica humana) que estimula en exceso al ovario para que produzca vulos. Esto es ms frecuente despus de procedimientos que ayudan a la concepcin de un beb (fertilizacin in vitro). CAUSAS   Los medicamentos para la fertilidad pueden provocar una afeccin mediante la cual se forman mltiples quistes de gran tamao en los ovarios. Esta se denomina sndrome de hiperestimulacin ovrica.  El sndrome del ovario poliqustico es una afeccin que puede causar desequilibrios hormonales, los cuales pueden dar como resultado quistes ovricos no funcionales. SIGNOS Y SNTOMAS  Muchos quistes ovricos no causan sntomas. Si se presentan sntomas, stos pueden ser:  Dolor o molestias en la pelvis.  Dolor en la parte baja del abdomen.  Dolor durante las relaciones sexuales.  Aumento del permetro abdominal (hinchazn).  Perodos menstruales anormales.  Aumento del dolor en los perodos menstruales.  Cese de los perodos menstruales sin estar embarazada. DIAGNSTICO  Estos quistes se descubren comnmente durante un examen de rutina o una exploracin ginecolgica anual. Es posible que se ordenen otros estudios para obtener ms informacin sobre el quiste. Estos estudios pueden ser:  Ecografas.  Radiografas de la pelvis.  Tomografa computada.  Resonancia magntica.  Anlisis de sangre. TRATAMIENTO  Muchos de los quistes ovricos desaparecen por s solos, sin tratamiento. Es probable que el mdico quiera controlar el quiste regularmente durante 2 o 3meses para ver si se produce algn cambio. En el caso de las mujeres en la menopausia, es particularmente importante controlar de cerca al  quiste ya que el ndice de cncer de ovario en las mujeres menopusicas es ms alto. Cuando se requiere tratamiento, este puede incluir cualquiera de los siguientes:  Un procedimiento para drenar el quiste (aspiracin). Esto se puede realizar mediante el uso de una aguja grande y una ecografa. Tambin se puede hacer a travs de un procedimiento laparoscpico, En este procedimiento, se inserta un tubo delgado que emite luz y que tiene una pequea cmara en un extremo (laparoscopio) a travs de una pequea incisin.  Ciruga para extirpar el quiste completo. Esto se puede realizar mediante una ciruga laparoscpica o una ciruga abierta, la cual implica realizar una incisin ms grande en la parte inferior del abdomen.  Tratamiento hormonal o pldoras anticonceptivas. Estos mtodos a veces se usan para ayudar a disolver un quiste. INSTRUCCIONES PARA EL CUIDADO EN EL HOGAR   Tome solo medicamentos de venta libre o recetados, segn las indicaciones del mdico.  Concurra a las consultas de control con su mdico segn las indicaciones.  Hgase exmenes plvicos regulares y pruebas de Papanicolaou. SOLICITE ATENCIN MDICA SI:   Los perodos se atrasan, son irregulares, dolorosos o cesan.  El dolor plvico o abdominal no desaparece.  El abdomen se agranda o se hincha.  Siente presin en la vejiga o no puede vaciarla completamente.  Siente dolor durante las relaciones sexuales.  Tiene una sensacin de hinchazn, presin o molestias en el estmago.  Pierde peso sin razn aparente.  Siente un malestar generalizado.  Est estreida.  Pierde el apetito.  Le aparece acn.  Nota un aumento del vello corporal y facial.    Aumenta de peso sin hacer modificaciones en su actividad fsica y en su dieta habitual.  Sospecha que est embarazada. SOLICITE ATENCIN MDICA DE INMEDIATO SI:   Siente cada vez ms dolor abdominal.  Tiene malestar estomacal (nuseas) y vomita.  Tiene fiebre que se  presenta de manera repentina.  Siente dolor abdominal al defecar.  Sus perodos menstruales son ms abundantes que lo habitual. ASEGRESE DE QUE:   Comprende estas instrucciones.  Controlar su afeccin.  Recibir ayuda de inmediato si no mejora o si empeora.   Esta informacin no tiene como fin reemplazar el consejo del mdico. Asegrese de hacerle al mdico cualquier pregunta que tenga.   Document Released: 03/17/2005 Document Revised: 06/12/2013 Elsevier Interactive Patient Education 2016 Elsevier Inc.  

## 2016-01-26 NOTE — Progress Notes (Signed)
   Patient is a 35 year old who was seen in the office on April 18 requesting an STD screen and during her exam her IUD (Mirena) had been placed in 2013 the string was not visualized and she was asked to return to the office for an ultrasound today. Since then patient stated that she has had some right lower quadrant discomfort especially during intercourse. Her STD screening consisting of GC and Chlamydia culture, HIV, RPR, hepatitis B and C were all negative.  Ultrasound: Uterus measures 7.6 x 5.3 x 3.2 cm with endometrial stripe at 2.4 mm. IUD was seen in the normal position. Right ovarian rim of tissue with thinwall echo-free cyst measuring 4.3 x 4.4 x 3.0 cm average size 3.97 m. Cysts internal low level echoes 18 x 14 mm was noted with negative color flow. Left ovarian cyst internal low level echoes measuring 10 x 9 mm negative color flow. No fluid in the cul-de-sac arterial blood flow was seen to both ovaries.   Assessment/plan: #1. Right lower quadrant pain especially during intercourse #2  3.9 cm echo-free thinwall right ovarian cysts and small left ovarian cyst and by 9 mm.. We will obtain a CA 125 today. She will receive Depo-Provera 150 mg IM today in an effort to suppress the cyst. She'll return back in 3 months for follow-up ultrasound which time she is due for her annual exam.  Literature information ovarian cyst as well as on CA 125 was provided in [place].  Gravida 50% time was spent in counseling coronary care with this patient. Total time of consultation 15 minutes

## 2016-01-27 LAB — CA 125: CA 125: 10 U/mL (ref <35)

## 2016-03-08 ENCOUNTER — Encounter: Payer: 59 | Admitting: Gynecology

## 2016-05-03 ENCOUNTER — Telehealth: Payer: Self-pay | Admitting: *Deleted

## 2016-05-03 NOTE — Telephone Encounter (Signed)
Per Lelon Frohlich at [facility] is covered at 80% after $2000 deductible met, only $1124.59 met therefore patient responsible for full amount of approx $431.21. Pt will be informed KW CMA

## 2016-05-17 ENCOUNTER — Other Ambulatory Visit: Payer: 59

## 2016-05-17 ENCOUNTER — Ambulatory Visit: Payer: 59 | Admitting: Gynecology

## 2016-05-20 ENCOUNTER — Encounter: Payer: 59 | Admitting: Gynecology

## 2016-11-03 ENCOUNTER — Encounter: Payer: Self-pay | Admitting: Gynecology

## 2017-04-07 ENCOUNTER — Encounter: Payer: Managed Care, Other (non HMO) | Admitting: Obstetrics & Gynecology

## 2017-04-08 ENCOUNTER — Encounter: Payer: Self-pay | Admitting: Obstetrics & Gynecology

## 2017-04-08 ENCOUNTER — Ambulatory Visit (INDEPENDENT_AMBULATORY_CARE_PROVIDER_SITE_OTHER): Payer: Managed Care, Other (non HMO) | Admitting: Obstetrics & Gynecology

## 2017-04-08 VITALS — BP 128/84 | Ht 60.0 in | Wt 142.0 lb

## 2017-04-08 DIAGNOSIS — Z975 Presence of (intrauterine) contraceptive device: Secondary | ICD-10-CM | POA: Diagnosis not present

## 2017-04-08 DIAGNOSIS — Z23 Encounter for immunization: Secondary | ICD-10-CM

## 2017-04-08 DIAGNOSIS — Z113 Encounter for screening for infections with a predominantly sexual mode of transmission: Secondary | ICD-10-CM | POA: Diagnosis not present

## 2017-04-08 DIAGNOSIS — Z01419 Encounter for gynecological examination (general) (routine) without abnormal findings: Secondary | ICD-10-CM | POA: Diagnosis not present

## 2017-04-08 DIAGNOSIS — Z1151 Encounter for screening for human papillomavirus (HPV): Secondary | ICD-10-CM

## 2017-04-08 NOTE — Addendum Note (Signed)
Addended by: Berna SpareASTILLO, BLANCA A on: 04/08/2017 03:09 PM   Modules accepted: Orders

## 2017-04-08 NOTE — Patient Instructions (Signed)
1. Encounter for routine gynecological examination with Papanicolaou smear of cervix Normal gyn exam.  Pap/HPV HR done.  Breasts wnl.  2. IUD contraception Time to change Mirena IUD after 5 years.  Appointment on Monday 04/11/2017.  IUD strings not seen, but IUD in good IU location per US 01/2016.  Will use US guidance if needed for removal.  3. Screen for STD (sexually transmitted disease) - Gono-Chlam on pap - HIV antibody (with reflex) - Hepatitis B Surface AntiGEN - Hepatitis C Antibody - RPR  Stacy Romero, it was a pleasure meeting you today!  I will see you again Monday!

## 2017-04-08 NOTE — Addendum Note (Signed)
Addended by: Berna SpareASTILLO, Brison Fiumara A on: 04/08/2017 03:00 PM   Modules accepted: Orders

## 2017-04-08 NOTE — Progress Notes (Signed)
Stacy Romero 05/07/1981 [id number]   History:    36 y.o. G1P1  New boyfriend.  RP:  Established patient presenting  for annual gyn exam   HPI:  On Mirena IUD x 2013.  Last year, IUD strings not visible, but IUD in good position per US.  Time to change IUD.  No vaginal bleeding.  No pelvic pain.  Normal secretions.  Breasts wnl.  Mictions/BMs wnl.  Would like STI screen.  Past medical history,surgical history, family history and social history were all reviewed and documented in the EPIC chart.  Gynecologic History No LMP recorded. Patient is not currently having periods (Reason: IUD). Contraception: IUD Mirena x 2013 Last Pap: 02/2015. Results were: normal Last mammogram: Never  Obstetric History OB History  Gravida Para Term Preterm AB Living  1 1 1     1   SAB TAB Ectopic Multiple Live Births          1    # Outcome Date GA Lbr Len/2nd Weight Sex Delivery Anes PTL Lv  1 Term     F Vag-Spont  N LIV       ROS: A ROS was performed and pertinent positives and negatives are included in the history.  GENERAL: No fevers or chills. HEENT: No change in vision, no earache, sore throat or sinus congestion. NECK: No pain or stiffness. CARDIOVASCULAR: No chest pain or pressure. No palpitations. PULMONARY: No shortness of breath, cough or wheeze. GASTROINTESTINAL: No abdominal pain, nausea, vomiting or diarrhea, melena or bright red blood per rectum. GENITOURINARY: No urinary frequency, urgency, hesitancy or dysuria. MUSCULOSKELETAL: No joint or muscle pain, no back pain, no recent trauma. DERMATOLOGIC: No rash, no itching, no lesions. ENDOCRINE: No polyuria, polydipsia, no heat or cold intolerance. No recent change in weight. HEMATOLOGICAL: No anemia or easy bruising or bleeding. NEUROLOGIC: No headache, seizures, numbness, tingling or weakness. PSYCHIATRIC: No depression, no loss of interest in normal activity or change in sleep pattern.     Exam:   BP 128/84   Ht 5' (1.524 m)   Wt  142 lb (64.4 kg)   BMI 27.73 kg/m   Body mass index is 27.73 kg/m.  General appearance : Well developed well nourished female. No acute distress HEENT: Eyes: no retinal hemorrhage or exudates,  Neck supple, trachea midline, no carotid bruits, no thyroidmegaly Lungs: Clear to auscultation, no rhonchi or wheezes, or rib retractions  Heart: Regular rate and rhythm, no murmurs or gallops Breast:Examined in sitting and supine position were symmetrical in appearance, no palpable masses or tenderness,  no skin retraction, no nipple inversion, no nipple discharge, no skin discoloration, no axillary or supraclavicular lymphadenopathy Abdomen: no palpable masses or tenderness, no rebound or guarding Extremities: no edema or skin discoloration or tenderness  Pelvic: Vulva normal  Bartholin, Urethra, Skene Glands: Within normal limits             Vagina: No gross lesions or discharge  Cervix: No gross lesions or discharge.  IUD strings not visible.  Pap/HPV HR/Gono-Chlam done.  Uterus  AV, normal size, shape and consistency, non-tender and mobile  Adnexa  Without masses or tenderness  Anus and perineum  normal     Assessment/Plan:  36 y.o. female for annual exam   1. Encounter for routine gynecological examination with Papanicolaou smear of cervix Normal gyn exam.  Pap/HPV HR done.  Breasts wnl.  2. IUD contraception Time to change Mirena IUD after 5 years.  Appointment on Monday 04/11/2017.  IUD  strings not seen, but IUD in good IU location per [place] 01/2016.  Will use US guidance if needed for removal.  3. Screen for STD (sexually transmitted disease) - Gono-Chlam on pap - HIV antibody (with reflex) - Hepatitis B Surface AntiGEN - Hepatitis C Antibody - RPR  Genia Del MD, 2:28 PM 04/08/2017

## 2017-04-11 ENCOUNTER — Encounter: Payer: Self-pay | Admitting: Obstetrics & Gynecology

## 2017-04-11 ENCOUNTER — Encounter (HOSPITAL_COMMUNITY): Payer: Self-pay | Admitting: Anesthesiology

## 2017-04-11 ENCOUNTER — Inpatient Hospital Stay (HOSPITAL_COMMUNITY): Payer: Managed Care, Other (non HMO) | Admitting: Anesthesiology

## 2017-04-11 ENCOUNTER — Ambulatory Visit (INDEPENDENT_AMBULATORY_CARE_PROVIDER_SITE_OTHER): Payer: Managed Care, Other (non HMO)

## 2017-04-11 ENCOUNTER — Other Ambulatory Visit: Payer: Self-pay | Admitting: Obstetrics & Gynecology

## 2017-04-11 ENCOUNTER — Ambulatory Visit: Admit: 2017-04-11 | Payer: No Typology Code available for payment source | Admitting: Obstetrics & Gynecology

## 2017-04-11 ENCOUNTER — Ambulatory Visit (INDEPENDENT_AMBULATORY_CARE_PROVIDER_SITE_OTHER): Payer: Managed Care, Other (non HMO) | Admitting: Obstetrics & Gynecology

## 2017-04-11 ENCOUNTER — Ambulatory Visit (HOSPITAL_COMMUNITY)
Admission: AD | Admit: 2017-04-11 | Discharge: 2017-04-11 | Disposition: A | Discharge status: Home / Self Care | Payer: Managed Care, Other (non HMO) | Source: Ambulatory Visit | Attending: Obstetrics & Gynecology | Admitting: Obstetrics & Gynecology

## 2017-04-11 ENCOUNTER — Encounter (HOSPITAL_COMMUNITY)
Admission: AD | Disposition: A | Discharge status: Home / Self Care | Payer: Self-pay | Source: Ambulatory Visit | Attending: Obstetrics & Gynecology

## 2017-04-11 VITALS — BP 122/80

## 2017-04-11 DIAGNOSIS — Z30430 Encounter for insertion of intrauterine contraceptive device: Secondary | ICD-10-CM

## 2017-04-11 DIAGNOSIS — Y763 Surgical instruments, materials and obstetric and gynecological devices (including sutures) associated with adverse incidents: Secondary | ICD-10-CM | POA: Insufficient documentation

## 2017-04-11 DIAGNOSIS — Z30433 Encounter for removal and reinsertion of intrauterine contraceptive device: Secondary | ICD-10-CM

## 2017-04-11 DIAGNOSIS — T8332XA Displacement of intrauterine contraceptive device, initial encounter: Secondary | ICD-10-CM | POA: Insufficient documentation

## 2017-04-11 DIAGNOSIS — T8339XS Other mechanical complication of intrauterine contraceptive device, sequela: Secondary | ICD-10-CM | POA: Diagnosis not present

## 2017-04-11 DIAGNOSIS — R102 Pelvic and perineal pain: Secondary | ICD-10-CM | POA: Diagnosis not present

## 2017-04-11 DIAGNOSIS — F419 Anxiety disorder, unspecified: Secondary | ICD-10-CM | POA: Diagnosis not present

## 2017-04-11 DIAGNOSIS — Z30432 Encounter for removal of intrauterine contraceptive device: Secondary | ICD-10-CM

## 2017-04-11 DIAGNOSIS — T8332XD Displacement of intrauterine contraceptive device, subsequent encounter: Secondary | ICD-10-CM

## 2017-04-11 DIAGNOSIS — T85628S Displacement of other specified internal prosthetic devices, implants and grafts, sequela: Secondary | ICD-10-CM | POA: Diagnosis not present

## 2017-04-11 HISTORY — PX: HYSTEROSCOPY: SHX211

## 2017-04-11 LAB — CBC
HEMATOCRIT: 41 % (ref 36.0–46.0)
HEMOGLOBIN: 13.9 g/dL (ref 12.0–15.0)
MCH: 31.3 pg (ref 26.0–34.0)
MCHC: 33.9 g/dL (ref 30.0–36.0)
MCV: 92.3 fL (ref 78.0–100.0)
Platelets: 306 10*3/uL (ref 150–400)
RBC: 4.44 MIL/uL (ref 3.87–5.11)
RDW: 12.3 % (ref 11.5–15.5)
WBC: 9.9 10*3/uL (ref 4.0–10.5)

## 2017-04-11 LAB — POCT PREGNANCY, URINE: Preg Test, Ur: NEGATIVE

## 2017-04-11 LAB — HEPATITIS B SURFACE ANTIGEN: Hepatitis B Surface Ag: NONREACTIVE

## 2017-04-11 LAB — RPR: RPR Ser Ql: NONREACTIVE

## 2017-04-11 LAB — PREGNANCY, URINE: Preg Test, Ur: NEGATIVE

## 2017-04-11 LAB — HEPATITIS C ANTIBODY
HEP C AB: NONREACTIVE
SIGNAL TO CUT-OFF: 0.02 (ref <1.00)

## 2017-04-11 LAB — HIV ANTIBODY (ROUTINE TESTING W REFLEX): HIV 1&2 Ab, 4th Generation: NONREACTIVE

## 2017-04-11 SURGERY — HYSTEROSCOPY
Anesthesia: General

## 2017-04-11 SURGERY — HYSTEROSCOPY
Anesthesia: General | Site: Vagina

## 2017-04-11 MED ORDER — OXYCODONE-ACETAMINOPHEN 7.5-325 MG PO TABS: 1.0000 | ORAL_TABLET | Freq: Four times a day (QID) | ORAL | 0 refills | Status: DC | PRN

## 2017-04-11 MED ORDER — FENTANYL CITRATE (PF) 100 MCG/2ML IJ SOLN: 25.0000 ug | INTRAMUSCULAR | Status: DC | PRN

## 2017-04-11 MED ORDER — ACETAMINOPHEN 325 MG PO TABS: 325.0000 mg | ORAL_TABLET | ORAL | Status: DC | PRN

## 2017-04-11 MED ORDER — CEFAZOLIN SODIUM-DEXTROSE 2-3 GM-%(50ML) IV SOLR
INTRAVENOUS | Status: DC | PRN
  Administered 2017-04-11: 2 g via INTRAVENOUS

## 2017-04-11 MED ORDER — PROPOFOL 10 MG/ML IV BOLUS
INTRAVENOUS | Status: AC
  Filled 2017-04-11: qty 20

## 2017-04-11 MED ORDER — KETOROLAC TROMETHAMINE 30 MG/ML IJ SOLN
INTRAMUSCULAR | Status: AC
  Filled 2017-04-11: qty 1

## 2017-04-11 MED ORDER — OXYCODONE HCL 5 MG PO TABS: 5.0000 mg | ORAL_TABLET | Freq: Once | ORAL | Status: DC | PRN

## 2017-04-11 MED ORDER — LIDOCAINE HCL (CARDIAC) 20 MG/ML IV SOLN
INTRAVENOUS | Status: AC
  Filled 2017-04-11: qty 5

## 2017-04-11 MED ORDER — MEPERIDINE HCL 25 MG/ML IJ SOLN: 6.2500 mg | INTRAMUSCULAR | Status: DC | PRN

## 2017-04-11 MED ORDER — PROPOFOL 10 MG/ML IV BOLUS
INTRAVENOUS | Status: DC | PRN
  Administered 2017-04-11: 200 mg via INTRAVENOUS

## 2017-04-11 MED ORDER — SOD CITRATE-CITRIC ACID 500-334 MG/5ML PO SOLN
30.0000 mL | Freq: Once | ORAL | Status: AC
  Administered 2017-04-11: 30 mL via ORAL

## 2017-04-11 MED ORDER — KETOROLAC TROMETHAMINE 30 MG/ML IJ SOLN
INTRAMUSCULAR | Status: DC | PRN
  Administered 2017-04-11: 30 mg via INTRAVENOUS

## 2017-04-11 MED ORDER — CEFAZOLIN SODIUM-DEXTROSE 2-3 GM-%(50ML) IV SOLR
INTRAVENOUS | Status: AC
  Filled 2017-04-11: qty 50

## 2017-04-11 MED ORDER — OXYCODONE HCL 5 MG/5ML PO SOLN: 5.0000 mg | Freq: Once | ORAL | Status: DC | PRN

## 2017-04-11 MED ORDER — ACETAMINOPHEN 160 MG/5ML PO SOLN: 325.0000 mg | ORAL | Status: DC | PRN

## 2017-04-11 MED ORDER — LIDOCAINE HCL 1 % IJ SOLN
INTRAMUSCULAR | Status: DC | PRN
  Administered 2017-04-11: 20 mL

## 2017-04-11 MED ORDER — KETOROLAC TROMETHAMINE 30 MG/ML IJ SOLN: 30.0000 mg | Freq: Once | INTRAMUSCULAR | Status: DC | PRN

## 2017-04-11 MED ORDER — FENTANYL CITRATE (PF) 100 MCG/2ML IJ SOLN
INTRAMUSCULAR | Status: DC | PRN
  Administered 2017-04-11: 100 ug via INTRAVENOUS

## 2017-04-11 MED ORDER — LACTATED RINGERS IV SOLN
INTRAVENOUS | Status: DC
  Administered 2017-04-11 (×2): via INTRAVENOUS

## 2017-04-11 MED ORDER — LACTATED RINGERS IV SOLN: INTRAVENOUS | Status: DC

## 2017-04-11 MED ORDER — FENTANYL CITRATE (PF) 100 MCG/2ML IJ SOLN
INTRAMUSCULAR | Status: AC
  Filled 2017-04-11: qty 2

## 2017-04-11 MED ORDER — MIDAZOLAM HCL 5 MG/5ML IJ SOLN
INTRAMUSCULAR | Status: DC | PRN
  Administered 2017-04-11: 2 mg via INTRAVENOUS

## 2017-04-11 MED ORDER — LIDOCAINE HCL 1 % IJ SOLN
10.0000 mL | Freq: Once | INTRAMUSCULAR | Status: AC
  Administered 2017-04-11: 10 mL

## 2017-04-11 MED ORDER — SOD CITRATE-CITRIC ACID 500-334 MG/5ML PO SOLN
ORAL | Status: AC
  Administered 2017-04-11: 30 mL via ORAL
  Filled 2017-04-11: qty 15

## 2017-04-11 MED ORDER — FAMOTIDINE IN NACL 20-0.9 MG/50ML-% IV SOLN: 20.0000 mg | Freq: Once | INTRAVENOUS | Status: DC

## 2017-04-11 MED ORDER — SODIUM CHLORIDE 0.9 % IR SOLN
Status: DC | PRN
  Administered 2017-04-11: 3000 mL

## 2017-04-11 MED ORDER — LIDOCAINE HCL (CARDIAC) 20 MG/ML IV SOLN
INTRAVENOUS | Status: DC | PRN
  Administered 2017-04-11: 30 mg via INTRAVENOUS

## 2017-04-11 MED ORDER — ONDANSETRON HCL 4 MG/2ML IJ SOLN
INTRAMUSCULAR | Status: AC
  Filled 2017-04-11: qty 2

## 2017-04-11 MED ORDER — DEXAMETHASONE SODIUM PHOSPHATE 10 MG/ML IJ SOLN
INTRAMUSCULAR | Status: AC
  Filled 2017-04-11: qty 1

## 2017-04-11 MED ORDER — ONDANSETRON HCL 4 MG/2ML IJ SOLN
INTRAMUSCULAR | Status: DC | PRN
  Administered 2017-04-11: 4 mg via INTRAVENOUS

## 2017-04-11 MED ORDER — ONDANSETRON HCL 4 MG/2ML IJ SOLN
4.0000 mg | Freq: Once | INTRAMUSCULAR | Status: DC | PRN
Start: 2017-04-11 — End: 2017-04-11

## 2017-04-11 MED ORDER — DEXAMETHASONE SODIUM PHOSPHATE 10 MG/ML IJ SOLN
INTRAMUSCULAR | Status: DC | PRN
Start: 2017-04-11 — End: 2017-04-11
  Administered 2017-04-11: 10 mg via INTRAVENOUS

## 2017-04-11 MED ORDER — MIDAZOLAM HCL 2 MG/2ML IJ SOLN
INTRAMUSCULAR | Status: AC
  Filled 2017-04-11: qty 2

## 2017-04-11 SURGICAL SUPPLY — 13 items
CANISTER SUCT 3000ML PPV (MISCELLANEOUS) ×3 IMPLANT
CATH ROBINSON RED A/P 16FR (CATHETERS) ×3 IMPLANT
CONTAINER PREFILL 10% NBF 60ML (FORM) ×2 IMPLANT
GLOVE BIO SURGEON STRL SZ 6.5 (GLOVE) ×2 IMPLANT
GLOVE BIO SURGEONS STRL SZ 6.5 (GLOVE) ×1
GLOVE BIOGEL PI IND STRL 7.0 (GLOVE) ×2 IMPLANT
GLOVE BIOGEL PI INDICATOR 7.0 (GLOVE) ×4
GOWN STRL REUS W/TWL LRG LVL3 (GOWN DISPOSABLE) ×6 IMPLANT
PACK VAGINAL MINOR WOMEN LF (CUSTOM PROCEDURE TRAY) ×3 IMPLANT
PAD OB MATERNITY 4.3X12.25 (PERSONAL CARE ITEMS) ×3 IMPLANT
TOWEL OR 17X24 6PK STRL BLUE (TOWEL DISPOSABLE) ×6 IMPLANT
TUBING AQUILEX INFLOW (TUBING) ×3 IMPLANT
TUBING AQUILEX OUTFLOW (TUBING) ×3 IMPLANT

## 2017-04-11 NOTE — Op Note (Signed)
Operative Note  04/11/2017  6:14 PM  PATIENT:  Stacy Romero  36 y.o. female  PRE-OPERATIVE DIAGNOSIS:  Retained iud, unsuccessful removal under US guidance  POST-OPERATIVE DIAGNOSIS:  Retained iud, unsuccessful removal under US guidance, successful removal under Hysteroscopy  PROCEDURE:  Procedure(s): HYSTEROSCOPY with removal of IUD  SURGEON:  Surgeon(s): Genia DelLavoie, Marie-Lyne, MD  ANESTHESIA:   general with Laryngeal Mask  FINDINGS:  IUD in Intra-Uterine cavity with strings going up towards fundus, no myometrial migration of IUD  DESCRIPTION OF OPERATION:  Under general anesthesia with laryngeal mask, the patient is in lithotomy position.  She is prepped with Betadine on the suprapubic, vulvar and vaginal areas.  The bladder is catheterized with a foley.  She is draped as usual.  Time out is done.  Ancef 2 g IV is given.  The vaginal exam reveals an anteverted uterus, normal volume, mobile, no adnexal mass.  The speculum is inserted in the vagina.  The anterior lip of the cervix is grasped with a tenaculum.  The IUD strings are still not visible.  A paracervical block is done with Lidocaine 1% a total of 20 cc at 4 and 8 O'clock.  Dilation of the cervix with Hegar dilators up to number 23 without difficulty.  Insertion of the Hysteroscope in the intra-uterine cavity.  Visualization of the IUD in good position, but with short strings going up towards the fundus.  Pictures are taken of the IUD in the intra-uterine cavity. The grasper is introduced through the Hysteroscope.  The strings are grasped close to the IUD at the knot for a better grasp.  The IUD is easily removed from the intra-uterine cavity as the Hysteroscope is removed as well.  The IUD is complete and intact with the strings.  The Hysteroscope is reintroduced in the intra-uterine cavity.  Pictures are taken of both ostia and of the normal intra-uterine cavity.  The Hysteroscope is removed.  The tenaculum is removed from the  cervix.  The speculum is removed.  Pictures are taken of the complete intact IUD with the strings and it is then discarded.  The patient is brought to recovery room in good and stable status.  ESTIMATED BLOOD LOSS: 5 mL FLUID DEFICIT:  100 mL  Intake/Output Summary (Last 24 hours) at 04/11/17 1814 Last data filed at 04/11/17 1745  Gross per 24 hour  Intake             1000 ml  Output               80 ml  Net              920 ml     BLOOD ADMINISTERED:none   LOCAL MEDICATIONS USED:  LIDOCAINE 1% 20 cc Paracervical block  SPECIMEN:  Source of Specimen:  Complete intact IUD with strings  DISPOSITION OF SPECIMEN:  Pictures taken and IUD/strings discarded  COUNTS:  YES  PLAN OF CARE: Transfer to PACU  Marie-Lyne LavoieMD6:14 PM

## 2017-04-11 NOTE — MAU Note (Signed)
Had appt to remove IUD today in office.  Unable to remove.  Dr did a shot in there and did an US, still was unable to remove it.  Having some cramping and bleeding now.  IUD is stuck, sent over to be taken to OR for removal

## 2017-04-11 NOTE — Patient Instructions (Signed)
1. Encounter for IUD removal and reinsertion Unsuccessful removal of IUD under US guidance.  Patient is NPO.  [facility]  Surgery and risks reviewed.  Patient understands and agrees.  2. Intrauterine contraceptive device threads lost, subsequent encounter As above.

## 2017-04-11 NOTE — Anesthesia Postprocedure Evaluation (Signed)
Anesthesia Post Note  Patient: Stacy Romero  Procedure(s) Performed: HYSTEROSCOPY (N/A Vagina )     Patient location during evaluation: PACU Anesthesia Type: General Level of consciousness: awake and alert Pain management: pain level controlled Vital Signs Assessment: post-procedure vital signs reviewed and stable Respiratory status: spontaneous breathing, nonlabored ventilation and respiratory function stable Cardiovascular status: blood pressure returned to baseline and stable Postop Assessment: no apparent nausea or vomiting Anesthetic complications: no    Last Vitals:  Vitals:   04/11/17 1900 04/11/17 1915  BP: 112/84 113/82  Pulse: 73 75  Resp: 14 12  Temp:    SpO2: 98% 95%    Last Pain:  Vitals:   04/11/17 1900  TempSrc:   PainSc: 0-No pain   Pain Goal: Patients Stated Pain Goal: 3 (04/11/17 1830)               Lowella CurbWarren Ray Demica Zook

## 2017-04-11 NOTE — Discharge Instructions (Addendum)
Hysteroscopy, Care After Refer to this sheet in the next few weeks. These instructions provide you with information on caring for yourself after your procedure. Your health care provider may also give you more specific instructions. Your treatment has been planned according to current medical practices, but problems sometimes occur. Call your health care provider if you have any problems or questions after your procedure. What can I expect after the procedure? After your procedure, it is typical to have the following:  You may have some cramping. This normally lasts for a couple days.  You may have bleeding. This can vary from light spotting for a few days to menstrual-like bleeding for 3-7 days.  Follow these instructions at home:  Rest for the first 1-2 days after the procedure.  Only take over-the-counter or prescription medicines as directed by your health care provider. Do not take aspirin. It can increase the chances of bleeding.  Take showers instead of baths for 2 weeks or as directed by your health care provider.  Do not drive for 24 hours or as directed.  Do not drink alcohol while taking pain medicine.  Do not use tampons, douche, or have sexual intercourse for 2 weeks or until your health care provider says it is okay.  Take your temperature twice a day for 4-5 days. Write it down each time.  Follow your health care provider's advice about diet, exercise, and lifting.  If you develop constipation, you may: ? Take a mild laxative if your health care provider approves. ? Add bran foods to your diet. ? Drink enough fluids to keep your urine clear or pale yellow.  Try to have someone with you or available to you for the first 24-48 hours, especially if you were given a general anesthetic.  Follow up with your health care provider as directed. Contact a health care provider if:  You feel dizzy or lightheaded.  You feel sick to your stomach (nauseous).  You have  abnormal vaginal discharge.  You have a rash.  You have pain that is not controlled with medicine. Get help right away if:  You have bleeding that is heavier than a normal menstrual period.  You have a fever.  You have increasing cramps or pain, not controlled with medicine.  You have new belly (abdominal) pain.  You pass out.  You have pain in the tops of your shoulders (shoulder strap areas).  You have shortness of breath. This information is not intended to replace advice given to you by your health care provider. Make sure you discuss any questions you have with your health care provider. Document Released: 03/28/2013 Document Revised: 11/13/2015 Document Reviewed: 01/04/2013 Elsevier Interactive Patient Education  2017 [employer]    Post Anesthesia Home Care Instructions  NO IBUPROFEN PRODUCTS UNTIL: 12:00 midnight   Activity: Get plenty of rest for the remainder of the day. A responsible individual must stay with you for 24 hours following the procedure.  For the next 24 hours, DO NOT: -Drive a car -Advertising copywriterperate machinery -Drink alcoholic beverages -Take any medication unless instructed by your physician -Make any legal decisions or sign important papers.  Meals: Start with liquid foods such as gelatin or soup. Progress to regular foods as tolerated. Avoid greasy, spicy, heavy foods. If nausea and/or vomiting occur, drink only clear liquids until the nausea and/or vomiting subsides. Call your physician if vomiting continues.  Special Instructions/Symptoms: Your throat may feel dry or sore from the anesthesia or the breathing tube placed in  your throat during surgery. If this causes discomfort, gargle with warm salt water. The discomfort should disappear within 24 hours.  If you had a scopolamine patch placed behind your ear for the management of post- operative nausea and/or vomiting:  1. The medication in the patch is effective for 72 hours, after which it should  be removed.  Wrap patch in a tissue and discard in the trash. Wash hands thoroughly with soap and water. 2. You may remove the patch earlier than 72 hours if you experience unpleasant side effects which may include dry mouth, dizziness or visual disturbances. 3. Avoid touching the patch. Wash your hands with soap and water after contact with the patch.

## 2017-04-11 NOTE — H&P (Signed)
Stacy Romero is an 36 y.o. female. G1P1001   RP:  Unsuccessful removal of IUD under US/Strings lost/[facility]  HPI:  IUD strings not visible at cervix.  IU location of IUD per US.  Unsuccessful attempt to remove under US today.  Pertinent Gynecological History: Menses: None on IUD Contraception: IUD Blood transfusions: none Sexually transmitted diseases: no past history Last pap: Normal OB History: G1P1   Menstrual History:  No LMP recorded. Patient is not currently having periods (Reason: IUD).    Past Medical History:  Diagnosis Date  . Anxiety   . ASCUS (atypical squamous cells of undetermined significance) on Pap smear    NEG. HR HPV ON 09/2008  . CIN III (cervical intraepithelial neoplasia grade III) with severe dysplasia 12/2011   LEEP [facility]   . IUD (intrauterine device) in place 01/19/2011   Paraguard  . Vaginal delivery    ONE NSVD    Past Surgical History:  Procedure Laterality Date  . INTRAUTERINE DEVICE (IUD) INSERTION  9/13   Mirena due out 9/18    Family History  Problem Relation Age of Onset  . Diabetes Father   . Hypertension Father     Social History:  reports that she has never smoked. She has never used smokeless tobacco. She reports that she drinks alcohol. She reports that she does not use drugs.  Allergies: No Known Allergies  Prescriptions Prior to Admission  Medication Sig Dispense Refill Last Dose  . DM-Doxylamine-Acetaminophen (NYQUIL COLD & FLU PO) Take 10 mLs by mouth daily as needed.   04/09/2017  . levonorgestrel (MIRENA) 20 MCG/24HR IUD 1 each by Intrauterine route once. Pt is having the IUD removed today 04/11/17.   Taking    REVIEW OF SYSTEMS: A ROS was performed and pertinent positives and negatives are included in the history.  GENERAL: No fevers or chills. HEENT: No change in vision, no earache, sore throat or sinus congestion. NECK: No pain or stiffness. CARDIOVASCULAR:  No chest pain or pressure. No palpitations. PULMONARY: No shortness of breath, cough or wheeze. GASTROINTESTINAL: No abdominal pain, nausea, vomiting or diarrhea, melena or bright red blood per rectum. GENITOURINARY: No urinary frequency, urgency, hesitancy or dysuria. MUSCULOSKELETAL: No joint or muscle pain, no back pain, no recent trauma. DERMATOLOGIC: No rash, no itching, no lesions. ENDOCRINE: No polyuria, polydipsia, no heat or cold intolerance. No recent change in weight. HEMATOLOGICAL: No anemia or easy bruising or bleeding. NEUROLOGIC: No headache, seizures, numbness, tingling or weakness. PSYCHIATRIC: No depression, no loss of interest in normal activity or change in sleep pattern.     Blood pressure 127/88, pulse 75, temperature 98.1 F (36.7 C), temperature source Oral, resp. rate 16, height 5' (1.524 m), weight 141 lb 4 oz (64.1 kg), SpO2 100 %.  Physical Exam:  See office notes  Results for orders placed or performed during the hospital encounter of 04/11/17 (from the past 24 hour(s))  CBC     Status: None   Collection Time: 04/11/17  4:21 PM  Result Value Ref Range   WBC 9.9 4.0 - 10.5 K/uL   RBC 4.44 3.87 - 5.11 MIL/uL   Hemoglobin 13.9 12.0 - 15.0 g/dL   HCT 16.141.0 09.636.0 - 04.546.0 %   MCV 92.3 78.0 - 100.0 fL   MCH 31.3 26.0 - 34.0 pg   MCHC 33.9 30.0 - 36.0 g/dL   RDW 40.912.3 81.111.5 - 91.415.5 %   Platelets 306 150 - 400 K/uL  Pregnancy, urine     Status: None   Collection Time: 04/11/17  4:44 PM  Result Value Ref Range   Preg Test, Ur NEGATIVE NEGATIVE  Pregnancy, urine POC     Status: None   Collection Time: 04/11/17  4:52 PM  Result Value Ref Range   Preg Test, Ur NEGATIVE NEGATIVE    Assessment/Plan:  1. Encounter for IUD removal and reinsertion Unsuccessful removal of IUD under US guidance.  Patient is NPO.  Schedule HSC/Removal [facility]UD at [facility] this PM.  Surgery and risks reviewed including trauma/hemorrhage/infection/DVT-PE/Anesthesia Cx.  Patient understands and  agrees.  2. Intrauterine contraceptive device threads lost, subsequent encounter As above.   Stacy Romero 04/11/2017, 5:24 PM

## 2017-04-11 NOTE — Anesthesia Preprocedure Evaluation (Signed)
Anesthesia Evaluation    Reviewed: Allergy & Precautions, H&P , NPO status , Patient's Chart, lab work & pertinent test results  Airway Mallampati: I  TM Distance: >3 FB Neck ROM: full    Dental no notable dental hx. (+) Teeth Intact   Pulmonary neg pulmonary ROS,    Pulmonary exam normal breath sounds clear to auscultation       Cardiovascular negative cardio ROS Normal cardiovascular exam Rhythm:regular Rate:Normal     Neuro/Psych    GI/Hepatic negative GI ROS, Neg liver ROS,   Endo/Other  negative endocrine ROS  Renal/GU negative Renal ROS     Musculoskeletal negative musculoskeletal ROS (+)   Abdominal Normal abdominal exam  (+)   Peds  Hematology negative hematology ROS (+)   Anesthesia Other Findings   Reproductive/Obstetrics negative OB ROS                             Anesthesia Physical Anesthesia Plan  ASA: II  Anesthesia Plan: General   Post-op Pain Management:    Induction: Intravenous  PONV Risk Score and Plan: 4 or greater and Ondansetron, Dexamethasone, Midazolam and Scopolamine patch - Pre-op  Airway Management Planned: LMA  Additional Equipment:   Intra-op Plan:   Post-operative Plan:   Informed Consent: I have reviewed the patients History and Physical, chart, labs and discussed the procedure including the risks, benefits and alternatives for the proposed anesthesia with the patient or authorized representative who has indicated his/her understanding and acceptance.   Dental Advisory Given  Plan Discussed with: CRNA and Surgeon  Anesthesia Plan Comments:         Anesthesia Quick Evaluation

## 2017-04-11 NOTE — Anesthesia Procedure Notes (Signed)
Procedure Name: LMA Insertion Date/Time: 04/11/2017 5:41 PM Performed by: Marrion CoyMERRITT, Elfrida Pixley R Pre-anesthesia Checklist: Patient identified, Emergency Drugs available, Suction available and Patient being monitored Patient Re-evaluated:Patient Re-evaluated prior to induction Oxygen Delivery Method: Circle system utilized Preoxygenation: Pre-oxygenation with 100% oxygen Induction Type: IV induction Ventilation: Mask ventilation without difficulty LMA: LMA inserted LMA Size: 4.0 Number of attempts: 1 Placement Confirmation: positive ETCO2 and breath sounds checked- equal and bilateral Dental Injury: Teeth and Oropharynx as per pre-operative assessment

## 2017-04-11 NOTE — Progress Notes (Addendum)
    Stacy Romero 09/03/1980 [id number]        36 y.o.  G1P1001   RP:  IUD removal and reinsertion  HPI:  IUD strings not visible at cervix, previous Pelvic US confirmed good IU location of IUD.  Past medical history,surgical history, problem list, medications, allergies, family history and social history were all reviewed and documented in the EPIC chart.  Directed ROS with pertinent positives and negatives documented in the history of present illness/assessment and plan.  Exam:  Vitals:   04/11/17 1158  BP: 122/80   General appearance:  Normal                                                                   IUD procedure note       Patient presented to the office today for removal and placement of Mirena IUD. The patient had previously been provided with literature information on this method of contraception. The risks benefits and pros and cons were discussed and all her questions were answered. She is fully aware that this form of contraception is 99% effective and is good for 5 years.  Pelvic exam: Vulva normal Vagina: No lesions or discharge Cervix: No lesions or discharge.  Strings not visible. Uterus: AV position Adnexa: No masses or tenderness Rectal exam: Not done  Removal of IUD attempted after Betadine/Hurricane Prep with Boseman/Os finder and hook instrument in Endocervical canal without success.  Decision to use US guidance for IUD removal and insertion.  The cervix was cleansed with Betadine solution. Hurricane spray on the cervix.  A single-tooth tenaculum was placed on the anterior cervical lip.  The IUD was confirmed to be in IU location by US.  Paracervical block with Lidocaine 1% 10 cc.  Dilation of cervix with Os finder and with Hegar dilator.  Narrow fenestrated clamp/Boseman and hooks utilized to attempt removal without success.  Possible embedded part of the IUD in myometrium.   Pelvic US:  T/V Uterus anteverted homogeneous 7.75 x 5.11 x 3.17 cm,  endometrial line 2.1 mm normal.  IUD seen in intra-uterine cavity.  T arm IUD, Right seen, Left T arm seen, but appears lower than Right.  Right and Left Ovaries normal.  No FF in CDS.  No apparent mass Right or Left adnexa.  IUD removal unsuccessful due to location of T arm of IUD.   Decision to stop attempting removal in office.  The single-tooth tenaculum and speculum were removed.  Patient tolerated the attempted removal very well.  IUD still in IU location per Transvaginal US at the end of the procedure.        Assessment/Plan:  36 y.o. G1P1001   1. Encounter for IUD removal and reinsertion Unsuccessful removal of IUD under US guidance.  Patient is NPO.  [facility]  Surgery and risks reviewed.  Patient understands and agrees.  2. Intrauterine contraceptive device threads lost, subsequent encounter As above.  Preop counseling >50% x 15 minutes.  Stacy DelMarie-Lyne Ellina Sivertsen MD, 12:43 PM 04/11/2017

## 2017-04-11 NOTE — Transfer of Care (Signed)
Immediate Anesthesia Transfer of Care Note  Patient: Stacy Romero  Procedure(s) Performed: HYSTEROSCOPY (N/A Vagina )  Patient Location: PACU  Anesthesia Type:General  Level of Consciousness: awake, alert , oriented and patient cooperative  Airway & Oxygen Therapy: Patient Spontanous Breathing and Patient connected to nasal cannula oxygen  Post-op Assessment: Report given to RN, Post -op Vital signs reviewed and stable and Patient moving all extremities X 4  Post vital signs: Reviewed and stable  Last Vitals:  Vitals:   04/11/17 1607  BP: 127/88  Pulse: 75  Resp: 16  Temp: 36.7 C  SpO2: 100%    Last Pain:  Vitals:   04/11/17 1607  TempSrc: Oral  PainSc:          Complications: No apparent anesthesia complications

## 2017-04-12 ENCOUNTER — Encounter (HOSPITAL_COMMUNITY): Payer: Self-pay | Admitting: Obstetrics & Gynecology

## 2017-04-12 LAB — PAP IG, CT-NG NAA, HPV HIGH-RISK
C. TRACHOMATIS RNA, TMA: NOT DETECTED
HPV DNA HIGH RISK: NOT DETECTED
N. GONORRHOEAE RNA, TMA: NOT DETECTED

## 2017-04-13 ENCOUNTER — Other Ambulatory Visit: Payer: Self-pay | Admitting: *Deleted

## 2017-04-13 DIAGNOSIS — Z975 Presence of (intrauterine) contraceptive device: Secondary | ICD-10-CM

## 2017-04-19 ENCOUNTER — Ambulatory Visit (INDEPENDENT_AMBULATORY_CARE_PROVIDER_SITE_OTHER): Payer: Managed Care, Other (non HMO) | Admitting: Obstetrics & Gynecology

## 2017-04-19 ENCOUNTER — Ambulatory Visit (INDEPENDENT_AMBULATORY_CARE_PROVIDER_SITE_OTHER): Payer: Managed Care, Other (non HMO)

## 2017-04-19 DIAGNOSIS — Z975 Presence of (intrauterine) contraceptive device: Secondary | ICD-10-CM

## 2017-04-19 DIAGNOSIS — Z30430 Encounter for insertion of intrauterine contraceptive device: Secondary | ICD-10-CM | POA: Diagnosis not present

## 2017-04-19 NOTE — Progress Notes (Signed)
    Stacy Romero 08/21/1980 [id number]        36 y.o.  G1P1001 Boyfriend  RP:  Mirena IUD insertion  HPI:  Difficult removal of Mirena because of lost strings, [facility] 04/11/2017 without difficulty and no complication.  Well since then.  Not sexually active since then.  Past medical history,surgical history, problem list, medications, allergies, family history and social history were all reviewed and documented in the EPIC chart.  Directed ROS with pertinent positives and negatives documented in the history of present illness/assessment and plan.  Exam:  There were no vitals filed for this visit. General appearance:  Normal                                                                    IUD procedure note       Patient presented to the office today for placement of Mirena IUD. The patient had previously been provided with literature information on this method of contraception. The risks benefits and pros and cons were discussed and all her questions were answered. She is fully aware that this form of contraception is 99% effective and is good for 5 years.  The cervix was cleansed with Betadine solution. Hurricane spray.  A single-tooth tenaculum was placed on the anterior cervical lip. The uterus sounded to 7 centimeter. The IUD was shown to the patient and inserted in a sterile fashion under US guidance. The IUD string was trimmed. The single-tooth tenaculum was removed. Patient was instructed to return back to the office in one month for follow up.   Pelvic US post insertion showed the IUD in good IU position.       Assessment/Plan:  36 y.o. G1P1001    1. Encounter for IUD insertion Easy insertion of Mirena IUD under US guidance and US confirmation of good IU placement.  F/U 4 wks for IUD check.  Genia DelMarie-Lyne [name] MD, 12:24 PM 04/19/2017

## 2017-04-20 NOTE — Patient Instructions (Signed)
1. Encounter for IUD insertion Easy insertion of Mirena IUD under US guidance and US confirmation of good IU placement.  F/U 4 wks for IUD check.  Stacy Romero, it was a pleasure seeing you today!  Colocacin de un dispositivo intrauterino - Cuidados posteriores (Intrauterine Device Insertion, Care After) Siga estas instrucciones durante las prximas semanas. Estas indicaciones le proporcionan informacin general acerca de cmo deber cuidarse despus del procedimiento. El mdico tambin podr darle instrucciones ms especficas. El tratamiento ha sido planificado segn las prcticas mdicas actuales, pero en algunos casos pueden ocurrir problemas. Comunquese con el mdico si tiene algn problema o tiene dudas despus del procedimiento. QU ESPERAR DESPUS DEL PROCEDIMIENTO La insercin del DIU puede causar molestias, como clicos. que deberan mejorar una vez que el DIU est en su lugar. Podr tener sangrado despus del procedimiento. Esto es normal. Vara desde un sangrado ligero durante un par de Countrywide Financialdas hasta un sangrado similar al menstrual. Cuando el DIU est en su lugar, se extender un hilo de 1 a 2pulgadas (2,5 a 5cm) por el cuello del tero en la vagina. El hilo no debera molestarle a usted ni a su pareja. De lo contrario, consulte con su mdico. INSTRUCCIONES PARA EL CUIDADO EN EL HOGAR  Controle su DIU para asegurarse de que est en su lugar, antes de reanudar la actividad sexual. Tiene que sentir los hilos. Si no los siente, algo puede estar mal. El DIU puede haberse salido del tero o ste puede haber sido atravesado (perforado) durante la colocacin. Adems, si los hilos son ms largos, puede significar que el DIU se est saliendo del tero. Si ocurre alguno de Limited Brandsestos problemas, no estar protegida y podr Scientist, research (physical sciences)quedar embarazada.  Puede volver a Management consultanttener relaciones sexuales si no tiene problemas con el DIU. El DIU de cobre se considera efectivo y funciona de inmediato, si se inserta dentro de los [address]  [address]  [address] del inicio del perodo. Ser necesario que utilice un mtodo anticonceptivo adicional durante 7 [place], si el DIU se inserta en algn otro momento del ciclo.  Controle que el DIU sigue en su lugar sintiendo los hilos despus de cada perodo menstrual.  Es posible que necesite tomar analgsicos, como acetaminofeno o ibuprofeno. Tome todos los medicamentos como le indic el mdico.  SOLICITE ATENCIN MDICA SI:  Tiene un sangrado ms abundante o dura ms de un ciclo menstrual normal.  Tiene fiebre.  Siente clicos o dolor abdominal que no se alivian con medicamentos.  Siente dolor abdominal que no parece estar relacionado con el rea en que senta los clicos y Chief Technology Officerel dolor anteriormente.  Se siente mareada, inusualmente dbil o se desmaya.  Tiene flujo vaginal u olores anormales.  Siente dolor durante las The St. Paul Travelersrelaciones sexuales.  No puede sentir los hilos del DIU o los siente ms largos.  Siente que el DIU est en la abertura del cuello del tero, en la vagina.  Piensa que est embarazada o no tiene su perodo menstrual.  El hilo del DIU est lastimando a su pareja sexual.  ASEGRESE DE QUE:  Comprende estas instrucciones.  Controlar su afeccin.  Recibir ayuda de inmediato si no mejora o si empeora.  Esta informacin no tiene Theme park managercomo fin reemplazar el consejo del mdico. Asegrese de hacerle al mdico cualquier pregunta que tenga. Document Released: 03/01/2012 Document Revised: 03/28/2013 Document Reviewed: 11/26/2012 Elsevier Interactive Patient Education  2017 [employer]

## 2017-04-25 ENCOUNTER — Encounter: Payer: Self-pay | Admitting: Anesthesiology

## 2017-06-03 ENCOUNTER — Ambulatory Visit (INDEPENDENT_AMBULATORY_CARE_PROVIDER_SITE_OTHER): Payer: Managed Care, Other (non HMO) | Admitting: Obstetrics & Gynecology

## 2017-06-03 ENCOUNTER — Encounter: Payer: Self-pay | Admitting: Obstetrics & Gynecology

## 2017-06-03 VITALS — BP 126/84

## 2017-06-03 DIAGNOSIS — Z30431 Encounter for routine checking of intrauterine contraceptive device: Secondary | ICD-10-CM

## 2017-06-03 NOTE — Patient Instructions (Signed)
1. IUD check up Mirena IUD well-tolerated and in good position with no sign of infection.  Strings felt as uncomfortable by her husband during intercourse, therefore trimmed today.  Stacy Romero, feliz de verla hoy!

## 2017-06-03 NOTE — Progress Notes (Signed)
    Stacy Romero 10/29/1980 [id number]        36 y.o.  G1P1001   RP:  Mirena IUD check 6 wks post insertion  HPI:  Patient doing very well, with no pelvic pain, no vaginal bleeding, no abnormal vaginal d/c, no fever.  Husband had mild discomfort, feeling the strings with Intercourse.  Past medical history,surgical history, problem list, medications, allergies, family history and social history were all reviewed and documented in the EPIC chart.  Directed ROS with pertinent positives and negatives documented in the history of present illness/assessment and plan.  Exam:  Vitals:   06/03/17 1402  BP: 126/84   General appearance:  Normal  Gyn exam: Vulva normal.  Speculum: Cervix normal with no erythema.  Normal vaginal secretions.  No bleeding.  IUD strings well visible.  Trimmed to about 1 cm visible (2 cm removed).   Assessment/Plan:  36 y.o. G1P1001   1. IUD check up Mirena IUD well-tolerated and in good position with no sign of infection.  Strings felt as uncomfortable by her husband during intercourse, therefore trimmed today.  Counseling on above issues more than 50% for 15 minutes.  Genia DelMarie-Lyne Amritpal Shropshire MD, 2:14 PM 06/03/2017

## 2017-06-07 ENCOUNTER — Other Ambulatory Visit: Payer: Self-pay | Admitting: Anesthesiology

## 2017-06-07 DIAGNOSIS — Z01419 Encounter for gynecological examination (general) (routine) without abnormal findings: Secondary | ICD-10-CM

## 2017-06-08 ENCOUNTER — Other Ambulatory Visit: Payer: Managed Care, Other (non HMO)

## 2017-06-08 DIAGNOSIS — Z01419 Encounter for gynecological examination (general) (routine) without abnormal findings: Secondary | ICD-10-CM

## 2017-06-08 LAB — LIPID PANEL
CHOL/HDL RATIO: 3.8 (calc) (ref <5.0)
Cholesterol: 165 mg/dL (ref <200)
HDL: 44 mg/dL — ABNORMAL LOW (ref >50)
LDL CHOLESTEROL (CALC): 99 mg/dL
Non-HDL Cholesterol (Calc): 121 mg/dL (calc) (ref <130)
Triglycerides: 120 mg/dL (ref <150)

## 2017-06-08 LAB — GLUCOSE, RANDOM: Glucose, Bld: 78 mg/dL (ref 65–99)

## 2017-06-20 ENCOUNTER — Encounter: Payer: Self-pay | Admitting: Anesthesiology

## 2017-10-02 ENCOUNTER — Encounter (HOSPITAL_BASED_OUTPATIENT_CLINIC_OR_DEPARTMENT_OTHER): Payer: Self-pay | Admitting: *Deleted

## 2017-10-02 ENCOUNTER — Other Ambulatory Visit: Payer: Self-pay

## 2017-10-02 ENCOUNTER — Emergency Department (HOSPITAL_BASED_OUTPATIENT_CLINIC_OR_DEPARTMENT_OTHER)
Admission: EM | Admit: 2017-10-02 | Discharge: 2017-10-03 | Disposition: A | Discharge status: Home / Self Care | Payer: Managed Care, Other (non HMO) | Attending: Emergency Medicine | Admitting: Emergency Medicine

## 2017-10-02 DIAGNOSIS — T148XXA Other injury of unspecified body region, initial encounter: Secondary | ICD-10-CM

## 2017-10-02 DIAGNOSIS — Y9389 Activity, other specified: Secondary | ICD-10-CM | POA: Diagnosis not present

## 2017-10-02 DIAGNOSIS — S29012A Strain of muscle and tendon of back wall of thorax, initial encounter: Secondary | ICD-10-CM | POA: Insufficient documentation

## 2017-10-02 DIAGNOSIS — S39012A Strain of muscle, fascia and tendon of lower back, initial encounter: Secondary | ICD-10-CM | POA: Insufficient documentation

## 2017-10-02 DIAGNOSIS — Y999 Unspecified external cause status: Secondary | ICD-10-CM | POA: Insufficient documentation

## 2017-10-02 DIAGNOSIS — S161XXA Strain of muscle, fascia and tendon at neck level, initial encounter: Secondary | ICD-10-CM | POA: Insufficient documentation

## 2017-10-02 DIAGNOSIS — S50812A Abrasion of left forearm, initial encounter: Secondary | ICD-10-CM | POA: Insufficient documentation

## 2017-10-02 DIAGNOSIS — Y92410 Unspecified street and highway as the place of occurrence of the external cause: Secondary | ICD-10-CM | POA: Insufficient documentation

## 2017-10-02 DIAGNOSIS — F419 Anxiety disorder, unspecified: Secondary | ICD-10-CM | POA: Insufficient documentation

## 2017-10-02 DIAGNOSIS — S199XXA Unspecified injury of neck, initial encounter: Secondary | ICD-10-CM | POA: Diagnosis present
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
## 2017-10-03 ENCOUNTER — Other Ambulatory Visit: Payer: Self-pay

## 2017-10-03 ENCOUNTER — Emergency Department (HOSPITAL_BASED_OUTPATIENT_CLINIC_OR_DEPARTMENT_OTHER): Payer: Managed Care, Other (non HMO)

## 2017-10-03 DIAGNOSIS — S161XXA Strain of muscle, fascia and tendon at neck level, initial encounter: Secondary | ICD-10-CM | POA: Diagnosis not present

## 2017-10-03 LAB — PREGNANCY, URINE: Preg Test, Ur: NEGATIVE

## 2017-10-03 MED ORDER — SILVER SULFADIAZINE 1 % EX CREA
TOPICAL_CREAM | Freq: Once | CUTANEOUS | Status: AC
  Administered 2017-10-03: 02:00:00 via TOPICAL
  Filled 2017-10-03: qty 85

## 2017-10-03 MED ORDER — METHOCARBAMOL 500 MG PO TABS: 500.0000 mg | ORAL_TABLET | Freq: Two times a day (BID) | ORAL | 0 refills | Status: DC
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
## 2017-10-03 NOTE — ED Notes (Signed)
ED Provider at bedside. 

## 2017-10-03 NOTE — ED Notes (Signed)
Patient transported to X-ray 
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
## 2019-02-08 ENCOUNTER — Other Ambulatory Visit: Payer: Self-pay

## 2019-02-09 ENCOUNTER — Ambulatory Visit (INDEPENDENT_AMBULATORY_CARE_PROVIDER_SITE_OTHER): Payer: Managed Care, Other (non HMO) | Admitting: Obstetrics & Gynecology

## 2019-02-09 ENCOUNTER — Encounter: Payer: Self-pay | Admitting: Obstetrics & Gynecology

## 2019-02-09 VITALS — BP 120/78 | Ht 60.0 in | Wt 128.0 lb

## 2019-02-09 DIAGNOSIS — Z975 Presence of (intrauterine) contraceptive device: Secondary | ICD-10-CM | POA: Diagnosis not present

## 2019-02-09 DIAGNOSIS — Z01419 Encounter for gynecological examination (general) (routine) without abnormal findings: Secondary | ICD-10-CM | POA: Diagnosis not present

## 2019-02-09 NOTE — Progress Notes (Signed)
Stacy Romero 1980-12-01 [id number]   History:    38 y.o. G1P1L1 Married  RP:  Established patient presenting for annual gyn exam   HPI: Well on Mirena IUD.  No menses, no BTB.  No pelvic pain.  No pain with IC.  Breasts normal.  Urine and bowel movements normal.  Body mass index 25.  Continue with fitness and healthy nutrition.  Health labs here today.  Past medical history,surgical history, family history and social history were all reviewed and documented in the EPIC chart.  Gynecologic History No LMP recorded. (Menstrual status: IUD). Contraception: Mirena IUD Last Pap: Per patient, Pap normal in 2019 Last mammogram: Never Bone Density: Never Colonoscopy: Never  Obstetric History OB History  Gravida Para Term Preterm AB Living  1 1 1     1   SAB TAB Ectopic Multiple Live Births          1    # Outcome Date GA Lbr Len/2nd Weight Sex Delivery Anes PTL Lv  1 Term     F Vag-Spont  N LIV     ROS: A ROS was performed and pertinent positives and negatives are included in the history.  GENERAL: No fevers or chills. HEENT: No change in vision, no earache, sore throat or sinus congestion. NECK: No pain or stiffness. CARDIOVASCULAR: No chest pain or pressure. No palpitations. PULMONARY: No shortness of breath, cough or wheeze. GASTROINTESTINAL: No abdominal pain, nausea, vomiting or diarrhea, melena or bright red blood per rectum. GENITOURINARY: No urinary frequency, urgency, hesitancy or dysuria. MUSCULOSKELETAL: No joint or muscle pain, no back pain, no recent trauma. DERMATOLOGIC: No rash, no itching, no lesions. ENDOCRINE: No polyuria, polydipsia, no heat or cold intolerance. No recent change in weight. HEMATOLOGICAL: No anemia or easy bruising or bleeding. NEUROLOGIC: No headache, seizures, numbness, tingling or weakness. PSYCHIATRIC: No depression, no loss of interest in normal activity or change in sleep pattern.     Exam:   BP 120/78   Ht 5' (1.524 m)   Wt 128 lb (58.1  kg)   BMI 25.00 kg/m   Body mass index is 25 kg/m.  General appearance : Well developed well nourished female. No acute distress HEENT: Eyes: no retinal hemorrhage or exudates,  Neck supple, trachea midline, no carotid bruits, no thyroidmegaly Lungs: Clear to auscultation, no rhonchi or wheezes, or rib retractions  Heart: Regular rate and rhythm, no murmurs or gallops Breast:Examined in sitting and supine position were symmetrical in appearance, no palpable masses or tenderness,  no skin retraction, no nipple inversion, no nipple discharge, no skin discoloration, no axillary or supraclavicular lymphadenopathy Abdomen: no palpable masses or tenderness, no rebound or guarding Extremities: no edema or skin discoloration or tenderness  Pelvic: Vulva: Normal             Vagina: No gross lesions or discharge  Cervix: No gross lesions or discharge.  Pap reflex done.  Short IUD strings, not seen, but felt with Bimanual exam.  Uterus  AV, normal size, shape and consistency, non-tender and mobile  Adnexa  Without masses or tenderness  Anus: Normal   Assessment/Plan:  38 y.o. female for annual exam   1. Encounter for routine gynecological examination with Papanicolaou smear of cervix Normal gynecologic exam.  Pap reflex done.  Breast exam normal.  Good body mass index at 25.  Continue with fitness and healthy nutrition.  Health labs here today. - CBC - Comp Met (CMET) - Lipid panel - TSH - VITAMIN D  25 Hydroxy (Vit-D Deficiency, Fractures) - Pap IG w/ reflex to HPV when ASC-U  2. IUD contraception Well on Mirena IUD.  IUD in good position with strings felt at the external os on exam.  Princess Bruins MD, 11:17 AM 02/09/2019

## 2019-02-10 LAB — COMPREHENSIVE METABOLIC PANEL
AG Ratio: 1.7 (calc) (ref 1.0–2.5)
ALT: 19 U/L (ref 6–29)
AST: 18 U/L (ref 10–30)
Albumin: 4.5 g/dL (ref 3.6–5.1)
Alkaline phosphatase (APISO): 40 U/L (ref 31–125)
BUN: 8 mg/dL (ref 7–25)
CO2: 24 mmol/L (ref 20–32)
Calcium: 10.1 mg/dL (ref 8.6–10.2)
Chloride: 105 mmol/L (ref 98–110)
Creat: 0.61 mg/dL (ref 0.50–1.10)
Globulin: 2.6 g/dL (calc) (ref 1.9–3.7)
Glucose, Bld: 85 mg/dL (ref 65–99)
Potassium: 4.5 mmol/L (ref 3.5–5.3)
Sodium: 140 mmol/L (ref 135–146)
Total Bilirubin: 0.5 mg/dL (ref 0.2–1.2)
Total Protein: 7.1 g/dL (ref 6.1–8.1)

## 2019-02-10 LAB — CBC
HCT: 40.3 % (ref 35.0–45.0)
Hemoglobin: 13.5 g/dL (ref 11.7–15.5)
MCH: 30.2 pg (ref 27.0–33.0)
MCHC: 33.5 g/dL (ref 32.0–36.0)
MCV: 90.2 fL (ref 80.0–100.0)
MPV: 10.1 fL (ref 7.5–12.5)
Platelets: 281 10*3/uL (ref 140–400)
RBC: 4.47 10*6/uL (ref 3.80–5.10)
RDW: 11.8 % (ref 11.0–15.0)
WBC: 7 10*3/uL (ref 3.8–10.8)

## 2019-02-10 LAB — TSH: TSH: 1.27 mIU/L

## 2019-02-10 LAB — LIPID PANEL
Cholesterol: 168 mg/dL (ref <200)
HDL: 41 mg/dL — ABNORMAL LOW (ref >=50)
LDL Cholesterol (Calc): 111 mg/dL (calc) — ABNORMAL HIGH
Non-HDL Cholesterol (Calc): 127 mg/dL (calc) (ref <130)
Total CHOL/HDL Ratio: 4.1 (calc) (ref <5.0)
Triglycerides: 72 mg/dL (ref <150)

## 2019-02-10 LAB — VITAMIN D 25 HYDROXY (VIT D DEFICIENCY, FRACTURES): Vit D, 25-Hydroxy: 17 ng/mL — ABNORMAL LOW (ref 30–100)

## 2019-02-12 LAB — PAP IG W/ RFLX HPV ASCU

## 2019-02-16 ENCOUNTER — Other Ambulatory Visit: Payer: Self-pay

## 2019-02-16 DIAGNOSIS — E559 Vitamin D deficiency, unspecified: Secondary | ICD-10-CM

## 2019-02-16 MED ORDER — VITAMIN D (ERGOCALCIFEROL) 1.25 MG (50000 UNIT) PO CAPS: 50000.0000 [IU] | ORAL_CAPSULE | ORAL | 0 refills | Status: DC

## 2019-02-17 ENCOUNTER — Encounter: Payer: Self-pay | Admitting: Obstetrics & Gynecology

## 2019-02-17 NOTE — Patient Instructions (Signed)
1. Encounter for routine gynecological examination with Papanicolaou smear of cervix Normal gynecologic exam.  Pap reflex done.  Breast exam normal.  Good body mass index at 25.  Continue with fitness and healthy nutrition.  Health labs here today. - CBC - Comp Met (CMET) - Lipid panel - TSH - VITAMIN D 25 Hydroxy (Vit-D Deficiency, Fractures) - Pap IG w/ reflex to HPV when ASC-U  2. IUD contraception Well on Mirena IUD.  IUD in good position with strings felt at the external os on exam.  Jadie, it was a pleasure seeing you today!  I will inform you of your results as soon as they are available.

## 2019-05-07 ENCOUNTER — Other Ambulatory Visit: Payer: Self-pay | Admitting: Obstetrics & Gynecology

## 2019-05-07 DIAGNOSIS — E559 Vitamin D deficiency, unspecified: Secondary | ICD-10-CM

## 2020-03-13 ENCOUNTER — Encounter: Payer: Managed Care, Other (non HMO) | Admitting: Obstetrics & Gynecology

## 2020-03-17 ENCOUNTER — Other Ambulatory Visit: Payer: Self-pay

## 2020-03-17 ENCOUNTER — Encounter: Payer: Self-pay | Admitting: Obstetrics & Gynecology

## 2020-03-17 ENCOUNTER — Ambulatory Visit (INDEPENDENT_AMBULATORY_CARE_PROVIDER_SITE_OTHER): Payer: Managed Care, Other (non HMO) | Admitting: Obstetrics & Gynecology

## 2020-03-17 VITALS — BP 118/80 | Ht 60.0 in | Wt 131.0 lb

## 2020-03-17 DIAGNOSIS — Z975 Presence of (intrauterine) contraceptive device: Secondary | ICD-10-CM

## 2020-03-17 DIAGNOSIS — Z8759 Personal history of other complications of pregnancy, childbirth and the puerperium: Secondary | ICD-10-CM

## 2020-03-17 DIAGNOSIS — Z01419 Encounter for gynecological examination (general) (routine) without abnormal findings: Secondary | ICD-10-CM

## 2020-03-17 DIAGNOSIS — T8332XA Displacement of intrauterine contraceptive device, initial encounter: Secondary | ICD-10-CM

## 2020-03-17 NOTE — Progress Notes (Signed)
Stacy Romero [birth year] [id number]   History:    39 y.o.  G1P1L1 Married  RP:  Established patient presenting for annual gyn exam   HPI: Well on Mirena IUD x 03/2017.  No menses, no BTB.  No pelvic pain.  No pain with IC.  Husband would like to have a baby.  H/O Severe PreEclampsia with PTB at about 32 weeks.  Daughter is now 38 yo.  Patient's sister also had PreEclampsia.  Breasts normal.  Urine and bowel movements normal.  Body mass index 25.58.  Continue with fitness and healthy nutrition.  Health labs with Fam MD.  Past medical history,surgical history, family history and social history were all reviewed and documented in the EPIC chart.  Gynecologic History Patient's last menstrual period was 03/17/2017.  Obstetric History OB History  Gravida Para Term Preterm AB Living  1 1 1     1   SAB TAB Ectopic Multiple Live Births          1    # Outcome Date GA Lbr Len/2nd Weight Sex Delivery Anes PTL Lv  1 Term     F Vag-Spont  N LIV     ROS: A ROS was performed and pertinent positives and negatives are included in the history.  GENERAL: No fevers or chills. HEENT: No change in vision, no earache, sore throat or sinus congestion. NECK: No pain or stiffness. CARDIOVASCULAR: No chest pain or pressure. No palpitations. PULMONARY: No shortness of breath, cough or wheeze. GASTROINTESTINAL: No abdominal pain, nausea, vomiting or diarrhea, melena or bright red blood per rectum. GENITOURINARY: No urinary frequency, urgency, hesitancy or dysuria. MUSCULOSKELETAL: No joint or muscle pain, no back pain, no recent trauma. DERMATOLOGIC: No rash, no itching, no lesions. ENDOCRINE: No polyuria, polydipsia, no heat or cold intolerance. No recent change in weight. HEMATOLOGICAL: No anemia or easy bruising or bleeding. NEUROLOGIC: No headache, seizures, numbness, tingling or weakness. PSYCHIATRIC: No depression, no loss of interest in normal activity or change in sleep pattern.      Exam:   BP 118/80 (BP Location: Right Arm, Patient Position: Sitting, Cuff Size: Normal)   Ht 5' (1.524 m)   Wt 131 lb (59.4 kg)   LMP 03/17/2017   BMI 25.58 kg/m   Body mass index is 25.58 kg/m.  General appearance : Well developed well nourished female. No acute distress HEENT: Eyes: no retinal hemorrhage or exudates,  Neck supple, trachea midline, no carotid bruits, no thyroidmegaly Lungs: Clear to auscultation, no rhonchi or wheezes, or rib retractions  Heart: Regular rate and rhythm, no murmurs or gallops Breast:Examined in sitting and supine position were symmetrical in appearance, no palpable masses or tenderness,  no skin retraction, no nipple inversion, no nipple discharge, no skin discoloration, no axillary or supraclavicular lymphadenopathy Abdomen: no palpable masses or tenderness, no rebound or guarding Extremities: no edema or skin discoloration or tenderness  Pelvic: Vulva: Normal             Vagina: No gross lesions or discharge  Cervix: No gross lesions or discharge.  Pap reflex done.  Uterus AV, normal size, shape and consistency, non-tender and mobile  Adnexa  Without masses or tenderness  Anus: Normal   Assessment/Plan:  39 y.o. female for annual exam   1. Encounter for routine gynecological examination with Papanicolaou smear of cervix Normal gynecologic exam.  Pap reflex done.  Breast exam normal.  Body mass index 25.58.  Continue with fitness and healthy nutrition.  2. IUD  contraception Well on Mirena IUD since 2018.  IUD strings not seen.  We will follow-up for a pelvic ultrasound to locate the IUD.  Usage of condoms in the meantime recommended.  3. Intrauterine contraceptive device threads lost, initial encounter IUD strings not seen of felt at the EO.  F/U Pelvic US to locate the IUD. - US Transvaginal Non-OB; Future  4. H/O severe pre-eclampsia Refer to [facility] for Pre-conception counseling on the risks of Severe  Pre-Eclampsi/HELLP Syndrome and AMA of 31-39 yo.  Other orders - levonorgestrel (MIRENA) 20 MCG/24HR IUD; 1 each by Intrauterine route once.  Genia Del MD, 11:51 AM 03/17/2020

## 2020-03-18 ENCOUNTER — Telehealth: Payer: Self-pay | Admitting: *Deleted

## 2020-03-18 DIAGNOSIS — Z8759 Personal history of other complications of pregnancy, childbirth and the puerperium: Secondary | ICD-10-CM

## 2020-03-18 LAB — PAP IG W/ RFLX HPV ASCU

## 2020-03-18 NOTE — Telephone Encounter (Signed)
Referral placed at [facility] Maternal fetal medicine they will call to schedule.

## 2020-03-18 NOTE — Telephone Encounter (Signed)
-----   Message from Genia Del, MD sent at 03/17/2020 12:15 PM EDT ----- Regarding: Refer to [facility] H/O Severe Pre-Eclampsia with PTB at about 32 weeks at age 39.  Sister had Pre-Eclampsia as well.  Patient is now 39 yo and new husband would like to have a child.  Refer asap to discuss the risks associated with H/O severe Pre-Eclampsia and AMA at 76-39 yo.

## 2020-03-19 ENCOUNTER — Encounter: Payer: Self-pay | Admitting: Obstetrics & Gynecology

## 2020-03-19 NOTE — Telephone Encounter (Signed)
Patient scheduled on 03/28/20

## 2020-03-28 ENCOUNTER — Ambulatory Visit: Payer: Managed Care, Other (non HMO) | Attending: Obstetrics and Gynecology | Admitting: Obstetrics and Gynecology

## 2020-03-28 ENCOUNTER — Ambulatory Visit: Payer: Managed Care, Other (non HMO) | Admitting: *Deleted

## 2020-03-28 ENCOUNTER — Other Ambulatory Visit: Payer: Self-pay

## 2020-03-28 DIAGNOSIS — Z8759 Personal history of other complications of pregnancy, childbirth and the puerperium: Secondary | ICD-10-CM | POA: Insufficient documentation

## 2020-03-28 DIAGNOSIS — Z3169 Encounter for other general counseling and advice on procreation: Secondary | ICD-10-CM | POA: Diagnosis not present

## 2020-03-28 DIAGNOSIS — O1414 Severe pre-eclampsia complicating childbirth: Secondary | ICD-10-CM | POA: Diagnosis not present

## 2020-03-28 DIAGNOSIS — O09529 Supervision of elderly multigravida, unspecified trimester: Secondary | ICD-10-CM

## 2020-03-28 DIAGNOSIS — O09519 Supervision of elderly primigravida, unspecified trimester: Secondary | ICD-10-CM

## 2020-03-28 NOTE — Consult Note (Addendum)
MFM Consult  Stacy Romero is a 39 year old gravida 1 para 1 who was seen for preconception consultation due to a history of severe preeclampsia requiring a 32-week delivery in her first pregnancy.  The patient reports that her first pregnancy occurred in 2001 when she was 39 years old in [place].  She reports that she experienced recurrent vaginal bleeding starting in the first trimester of that pregnancy.  At around 29 weeks, she began to experience whole body swelling and nausea after taking a trip.  She was evaluated at the hospital and was diagnosed with preeclampsia.  She was then hospitalized for the next 3 to 4 weeks.  At around 32 weeks, she underwent an induction due to severe preeclampsia and had a normal spontaneous vaginal delivery of a baby girl.  The patient reports that she was treated with magnesium sulfate for seizure prophylaxis after delivery.  Her daughter is now 42 years old and is doing well.  Due to concerns with the development of preeclampsia again, she has avoided conceiving another pregnancy since that time.  The patient reports that she recently got married to a 38 year old man who would like to have children of his own.    Due to concerns regarding the development of preeclampsia again during her future pregnancy, she was seen in consultation to discuss the risk of recurrence and management of her future pregnancy.  She is scheduled for an IUD removal next week.  The patient denies any significant past medical history.  The only surgery she has had is a hysteroscopy for removal of an IUD.  She denies any alcohol, tobacco, or illegal drug use.  She denies taking any medications.  She is in the process of receiving her COVID-19 vaccine.  She received the first dose a few weeks ago and is scheduled to receive the second dose soon.  The following was discussed during our consultation today:  History of severe preeclampsia requiring preterm delivery  The  patient was advised that the risk of recurrence of preeclampsia in someone with a prior history of the condition is up to 20% in her future pregnancies.  She was advised that taking a daily baby aspirin (81 mg daily) starting at around 12 to 14 weeks may decrease her risk of developing preeclampsia again.  She was reassured that as she is otherwise healthy, her history of severe preeclampsia will not preclude her from conceiving another pregnancy in the near future should she desire.    She was advised that should she develop severe preeclampsia again in her future pregnancy, that it can be managed depending on the gestational age at which it occurs.  Management may include prolonged hospitalization, the administration of a complete course of antenatal corticosteroids, and administration of antihypertensive medications to help control her blood pressures so that she can reach a more optimal gestational age for delivery.  She understands that delivery of the baby and placenta is the only treatment for preeclampsia.  Should she require a preterm delivery (prior to 36 weeks), her baby may require a prolonged NICU admission.  Advanced maternal age and pregnancy   The increased risk of having a fetus with Down syndrome due to advanced maternal age was discussed.  She was also advised that women of advanced maternal age may also be at increased risk for having an adverse pregnancy outcome such as the development of preeclampsia, gestational diabetes, and other conditions that may require a preterm delivery.  To help screen for fetal aneuploidy, I  would recommend that she have a cell free DNA test drawn at around 10 to 12 weeks. The cell free DNA test has a detection rate of Down syndrome of up to 99%.  She understands that an amniocentesis or CVS may be necessary for definitive diagnosis should the screening test be positive for Down syndrome.    Husband with situs inversus  She reports that her husband was  told that he has situs inversus.  Her husband's grandfather may also have had this condition.  Her husband is otherwise healthy and has not experienced any adverse sequelae due to situs inversus.  The patient was advised that situs inversus is a condition that may be diagnosed prenatally during her detailed fetal anatomy scan.  There may be a genetic component to situs inversus as it is often noted to occur within families.  She was reassured that most people with situs inversus will live a normal healthy life.  Should her fetus be found to have situs inversus, we will refer her for a fetal echocardiogram to rule out any significant cardiac anomalies.  Management of her future pregnancy   -Start taking a prenatal vitamin with extra folic acid starting today -First trimester ultrasound to confirm her dates -Cell free DNA test drawn to screen for Down syndrome at between 10 to 12 weeks -Start daily baby aspirin (81 mg) at between 12 to 14 weeks for preeclampsia prophylaxis -Detailed fetal anatomy scan at MFM office at around 19 weeks  -Serial growth ultrasounds -Management of preeclampsia depending on the gestational age at which she develops  The patient was reassured that as she is healthy with no medical problems, I anticipate that she will most likely have a good and successful pregnancy outcome.  She was reassured that many women who have had severe preeclampsia in one pregnancy, often did not have a recurrence of severe preeclampsia in their subsequent pregnancy.    At the end of the consultation, the patient stated that all her questions have been answered to her complete satisfaction.  We look forward to managing her future pregnancy with you in the future.    Thank you for referring this patient for a Maternal-Fetal Medicine consultation.    Total consult time 45 minutes.  This note was copied from an original document generated using [employer].

## 2020-03-28 NOTE — Progress Notes (Signed)
Pt here today for preconception counseling with Dr. Parke Poisson in MFM for [facility] and h/o severe PEC.  Currently has mirena IUD in place.  Reports no menstrual cycle.  BP134/85 Hr 68.

## 2020-04-10 ENCOUNTER — Ambulatory Visit (INDEPENDENT_AMBULATORY_CARE_PROVIDER_SITE_OTHER): Payer: Managed Care, Other (non HMO)

## 2020-04-10 ENCOUNTER — Other Ambulatory Visit: Payer: Self-pay | Admitting: Obstetrics & Gynecology

## 2020-04-10 ENCOUNTER — Other Ambulatory Visit: Payer: Self-pay

## 2020-04-10 ENCOUNTER — Ambulatory Visit (INDEPENDENT_AMBULATORY_CARE_PROVIDER_SITE_OTHER): Payer: Managed Care, Other (non HMO) | Admitting: Obstetrics & Gynecology

## 2020-04-10 DIAGNOSIS — Z3169 Encounter for other general counseling and advice on procreation: Secondary | ICD-10-CM | POA: Diagnosis not present

## 2020-04-10 DIAGNOSIS — Z30432 Encounter for removal of intrauterine contraceptive device: Secondary | ICD-10-CM | POA: Diagnosis not present

## 2020-04-10 DIAGNOSIS — N854 Malposition of uterus: Secondary | ICD-10-CM | POA: Diagnosis not present

## 2020-04-10 DIAGNOSIS — Z8759 Personal history of other complications of pregnancy, childbirth and the puerperium: Secondary | ICD-10-CM

## 2020-04-10 DIAGNOSIS — T8332XA Displacement of intrauterine contraceptive device, initial encounter: Secondary | ICD-10-CM

## 2020-04-10 DIAGNOSIS — T8332XD Displacement of intrauterine contraceptive device, subsequent encounter: Secondary | ICD-10-CM

## 2020-04-10 NOTE — Progress Notes (Signed)
    Stacy Romero [employer] [birth year] [id number]        39 y.o.  G1P1001 Married  RP: Desires conception/IUD strings lost for IUD removal  HPI: History of severe preterm preeclampsia and advanced maternal age.  Seen by fetal maternal medicine in consultation for counseling on risks of eventual pregnancy.  Will have early evaluation in first trimester and will start on baby aspirin at 12 weeks.  Ready to attempt conception and desires removal of the IUD.   OB History  Gravida Para Term Preterm AB Living  1 1 1     1   SAB TAB Ectopic Multiple Live Births          1    # Outcome Date GA Lbr Len/2nd Weight Sex Delivery Anes PTL Lv  1 Term     F Vag-Spont  N LIV    Past medical history,surgical history, problem list, medications, allergies, family history and social history were all reviewed and documented in the EPIC chart.   Directed ROS with pertinent positives and negatives documented in the history of present illness/assessment and plan.  Exam:  There were no vitals filed for this visit. General appearance:  Normal  Pelvic today: T/V images.  Anteverted uterus normal in size and shape with no myometrial mass.  The uterus is measured at 2.3 x 1.43 x 1.2 cm.  The endometrial line is thin and symmetrical at 2.07 mm.  The IUD is seen within the endometrial cavity in correct location with the string tip within 1 cm from the exocervix.  No mass or thickening seen.  Both ovaries are normal size with normal follicular pattern.  The right ovary presents a dominant follicle.  No adnexal mass seen.  No free fluid present in the posterior cul-de-sac.  IUD removal under US guidance performed successfully.  IUD complete and intact.  Mirena IUD removal under ultrasound guidance: Verbal consent obtained.  Speculum inserted.  Betadine prep of the cervix.  IUD removal clamp used and IUD strings clamped under ultrasound guidance in the endocervical canal.  Easy removal of IUD.  IUD complete and  intact.  Speculum removed.  Procedure well-tolerated by patient without complication.   Assessment/Plan:  39 y.o. G1P1001   1. Intrauterine contraceptive device threads lost, subsequent encounter Normal pelvic ultrasound.  IUD removed under ultrasound guidance without complication.  2. Encounter for IUD removal IUD removed under ultrasound guidance.  Well-tolerated and no complication.  3. Encounter for preconception consultation History of severe preterm preeclampsia and advanced maternal age.  Seen by fetal maternal medicine in consultation for counseling on risks of eventual pregnancy.  Patient is ready to attempt conception.  Will consult early in first trimester and start on baby aspirin at 12 weeks.  Continue with prenatal vitamins now.  Healthy nutrition and fitness discussed.  4. H/O severe pre-eclampsia Seen by fetal maternal medicine for counseling.  20 MD, 10:57 AM 04/10/2020

## 2020-04-13 ENCOUNTER — Encounter: Payer: Self-pay | Admitting: Obstetrics & Gynecology

## 2020-04-18 ENCOUNTER — Ambulatory Visit: Payer: No Typology Code available for payment source

## 2021-03-20 ENCOUNTER — Other Ambulatory Visit: Payer: Self-pay

## 2021-03-20 ENCOUNTER — Encounter: Payer: Self-pay | Admitting: Obstetrics & Gynecology

## 2021-03-20 ENCOUNTER — Ambulatory Visit (INDEPENDENT_AMBULATORY_CARE_PROVIDER_SITE_OTHER): Payer: 59 | Admitting: Obstetrics & Gynecology

## 2021-03-20 VITALS — BP 120/70 | HR 78 | Resp 16 | Ht 59.5 in | Wt 137.0 lb

## 2021-03-20 DIAGNOSIS — Z01419 Encounter for gynecological examination (general) (routine) without abnormal findings: Secondary | ICD-10-CM | POA: Diagnosis not present

## 2021-03-20 DIAGNOSIS — Z3042 Encounter for surveillance of injectable contraceptive: Secondary | ICD-10-CM | POA: Diagnosis not present

## 2021-03-20 DIAGNOSIS — Z30013 Encounter for initial prescription of injectable contraceptive: Secondary | ICD-10-CM

## 2021-03-20 LAB — PREGNANCY, URINE: Preg Test, Ur: NEGATIVE

## 2021-03-20 MED ORDER — MEDROXYPROGESTERONE ACETATE 150 MG/ML IM SUSP: 150.0000 mg | Freq: Once | INTRAMUSCULAR | Status: DC

## 2021-03-20 MED ORDER — MEDROXYPROGESTERONE ACETATE 150 MG/ML IM SUSP
150.0000 mg | Freq: Once | INTRAMUSCULAR | Status: AC
  Administered 2021-03-20: 150 mg via INTRAMUSCULAR

## 2021-03-20 MED ORDER — MEDROXYPROGESTERONE ACETATE 150 MG/ML IM SUSP: 150.0000 mg | INTRAMUSCULAR | 4 refills | Status: AC

## 2021-03-20 NOTE — Progress Notes (Signed)
Stacy Romero Dec 11, 1980 [id number]   History:    40 y.o. G1P1L1 Remarried.  Daughter is 33 yo, now here with her since 10/2020.   RP:  Established patient presenting for annual gyn exam    HPI:  Menses normal every month.  No BTB.  No pelvic pain.  No pain with IC.  H/O Severe PreEclampsia with PTB at about 32 weeks. Patient's sister also had PreEclampsia.  Was attempting conception x 1 year without success.  Husband would like to have a baby, but patient doesn't want another baby. Requesting DepoProvera contraception starting today.  LMP 02/27/2021, no IC since then.  Breasts normal.  Urine and bowel movements normal.  Body mass index 27.21.  Continue with fitness and healthy nutrition.  Health labs with Fam MD.    Past medical history,surgical history, family history and social history were all reviewed and documented in the EPIC chart.  Gynecologic History Patient's last menstrual period was 02/27/2021 (exact date).  Obstetric History OB History  Gravida Para Term Preterm AB Living  1 1 1     1   SAB IAB Ectopic Multiple Live Births          1    # Outcome Date GA Lbr Len/2nd Weight Sex Delivery Anes PTL Lv  1 Term     F Vag-Spont  N LIV     ROS: A ROS was performed and pertinent positives and negatives are included in the history.  GENERAL: No fevers or chills. HEENT: No change in vision, no earache, sore throat or sinus congestion. NECK: No pain or stiffness. CARDIOVASCULAR: No chest pain or pressure. No palpitations. PULMONARY: No shortness of breath, cough or wheeze. GASTROINTESTINAL: No abdominal pain, nausea, vomiting or diarrhea, melena or bright red blood per rectum. GENITOURINARY: No urinary frequency, urgency, hesitancy or dysuria. MUSCULOSKELETAL: No joint or muscle pain, no back pain, no recent trauma. DERMATOLOGIC: No rash, no itching, no lesions. ENDOCRINE: No polyuria, polydipsia, no heat or cold intolerance. No recent change in weight. HEMATOLOGICAL: No anemia  or easy bruising or bleeding. NEUROLOGIC: No headache, seizures, numbness, tingling or weakness. PSYCHIATRIC: No depression, no loss of interest in normal activity or change in sleep pattern.     Exam:   BP 120/70   Pulse 78   Resp 16   Ht 4' 11.5" (1.511 m)   Wt 137 lb (62.1 kg)   LMP 02/27/2021 (Exact Date)   BMI 27.21 kg/m   Body mass index is 27.21 kg/m.  General appearance : Well developed well nourished female. No acute distress HEENT: Eyes: no retinal hemorrhage or exudates,  Neck supple, trachea midline, no carotid bruits, no thyroidmegaly Lungs: Clear to auscultation, no rhonchi or wheezes, or rib retractions  Heart: Regular rate and rhythm, no murmurs or gallops Breast:Examined in sitting and supine position were symmetrical in appearance, no palpable masses or tenderness,  no skin retraction, no nipple inversion, no nipple discharge, no skin discoloration, no axillary or supraclavicular lymphadenopathy Abdomen: no palpable masses or tenderness, no rebound or guarding Extremities: no edema or skin discoloration or tenderness  Pelvic: Vulva: Normal             Vagina: No gross lesions or discharge  Cervix: No gross lesions or discharge  Uterus  AV, mobile, NT.  No adnexal mass, NT             Anal area normal  UPT Neg DepoProvera injection given   Assessment/Plan:  40 y.o. female for  annual exam   1. Well female exam with routine gynecological exam Normal gynecologic exam.  Pap test September 2021 was negative, will repeat at 2 to 3 years. Breast exam normal.  We will schedule a screening mammogram at age 50 in 2023.  Health labs with family physician.  Body mass index 27.21.  We will increase fitness activities to 5 times a week of aerobics and every 2 days for light weightlifting.  Recommend a mildly decreased calorie/carb diet.  2. Encounter for surveillance of injectable contraceptive Counseling on contraception done.  Decision to start on Depo-Provera  injections.  Usage, risks and benefits reviewed.  No contraindication.  First injection given today and prescription sent to pharmacy. - medroxyPROGESTERone (DEPO-PROVERA) injection 150 mg  3. Initiation of Depo Provera Urine pregnancy test negative. - Pregnancy, urine  Other orders - Prenatal Vit-Fe Fumarate-FA (PRENATAL VITAMIN PO); Take by mouth. - medroxyPROGESTERone (DEPO-PROVERA) injection 150 mg - medroxyPROGESTERone (DEPO-PROVERA) 150 MG/ML injection; Inject 1 mL (150 mg total) into the muscle every 3 (three) months.   Genia Del MD, 12:08 PM 03/20/2021

## 2021-06-08 ENCOUNTER — Ambulatory Visit (INDEPENDENT_AMBULATORY_CARE_PROVIDER_SITE_OTHER): Payer: 59

## 2021-06-08 ENCOUNTER — Other Ambulatory Visit: Payer: Self-pay

## 2021-06-08 DIAGNOSIS — Z3042 Encounter for surveillance of injectable contraceptive: Secondary | ICD-10-CM | POA: Diagnosis not present

## 2021-06-08 MED ORDER — MEDROXYPROGESTERONE ACETATE 150 MG/ML IM SUSP
150.0000 mg | Freq: Once | INTRAMUSCULAR | Status: AC
  Administered 2021-06-08: 08:00:00 150 mg via INTRAMUSCULAR

## 2021-08-31 ENCOUNTER — Ambulatory Visit (INDEPENDENT_AMBULATORY_CARE_PROVIDER_SITE_OTHER): Payer: BC Managed Care – PPO | Admitting: *Deleted

## 2021-08-31 ENCOUNTER — Other Ambulatory Visit: Payer: Self-pay

## 2021-08-31 VITALS — BP 120/78 | HR 66 | Resp 12 | Ht 60.0 in | Wt 144.0 lb

## 2021-08-31 DIAGNOSIS — Z3042 Encounter for surveillance of injectable contraceptive: Secondary | ICD-10-CM

## 2021-08-31 MED ORDER — MEDROXYPROGESTERONE ACETATE 150 MG/ML IM SUSP
150.0000 mg | Freq: Once | INTRAMUSCULAR | Status: AC
  Administered 2021-08-31: 150 mg via INTRAMUSCULAR

## 2021-08-31 NOTE — Progress Notes (Signed)
Patient is here for Depo Provera Injection ?Patient is within Depo Provera Calender Limits yes, last given 06-08-21 ?Next Depo Due between: 5/29 to 6/12 ?Last AEX: 03-20-21 ML ?AEX Scheduled: not scheduled  ? ?Patient is aware when next depo is due ? ?Pt tolerated Injection well in RUOQ.  ? ?Routed to provider for review, encounter closed. ?

## 2021-11-20 ENCOUNTER — Ambulatory Visit (INDEPENDENT_AMBULATORY_CARE_PROVIDER_SITE_OTHER): Payer: BC Managed Care – PPO

## 2021-11-20 DIAGNOSIS — Z3042 Encounter for surveillance of injectable contraceptive: Secondary | ICD-10-CM

## 2021-11-20 MED ORDER — MEDROXYPROGESTERONE ACETATE 150 MG/ML IM SUSP
150.0000 mg | Freq: Once | INTRAMUSCULAR | Status: AC
  Administered 2021-11-20: 150 mg via INTRAMUSCULAR

## 2022-02-12 ENCOUNTER — Ambulatory Visit: Payer: BC Managed Care – PPO

## 2023-03-16 ENCOUNTER — Emergency Department (HOSPITAL_BASED_OUTPATIENT_CLINIC_OR_DEPARTMENT_OTHER): Payer: BC Managed Care – PPO

## 2023-03-16 ENCOUNTER — Observation Stay (HOSPITAL_BASED_OUTPATIENT_CLINIC_OR_DEPARTMENT_OTHER)
Admission: EM | Admit: 2023-03-16 | Discharge: 2023-03-17 | Disposition: A | Discharge status: Home / Self Care | Payer: BC Managed Care – PPO | Attending: Surgery | Admitting: Surgery

## 2023-03-16 ENCOUNTER — Encounter (HOSPITAL_BASED_OUTPATIENT_CLINIC_OR_DEPARTMENT_OTHER): Payer: Self-pay

## 2023-03-16 ENCOUNTER — Other Ambulatory Visit: Payer: Self-pay

## 2023-03-16 DIAGNOSIS — K8012 Calculus of gallbladder with acute and chronic cholecystitis without obstruction: Principal | ICD-10-CM | POA: Insufficient documentation

## 2023-03-16 DIAGNOSIS — R1011 Right upper quadrant pain: Secondary | ICD-10-CM | POA: Diagnosis present

## 2023-03-16 DIAGNOSIS — K810 Acute cholecystitis: Secondary | ICD-10-CM | POA: Diagnosis present

## 2023-03-16 DIAGNOSIS — K819 Cholecystitis, unspecified: Principal | ICD-10-CM

## 2023-03-16 LAB — CBC WITH DIFFERENTIAL/PLATELET
Abs Immature Granulocytes: 0.02 10*3/uL (ref 0.00–0.07)
Basophils Absolute: 0.1 10*3/uL (ref 0.0–0.1)
Basophils Relative: 1 %
Eosinophils Absolute: 0.3 10*3/uL (ref 0.0–0.5)
Eosinophils Relative: 3 %
HCT: 39.2 % (ref 36.0–46.0)
Hemoglobin: 13.2 g/dL (ref 12.0–15.0)
Immature Granulocytes: 0 %
Lymphocytes Relative: 33 %
Lymphs Abs: 2.8 10*3/uL (ref 0.7–4.0)
MCH: 30.3 pg (ref 26.0–34.0)
MCHC: 33.7 g/dL (ref 30.0–36.0)
MCV: 89.9 fL (ref 80.0–100.0)
Monocytes Absolute: 0.4 10*3/uL (ref 0.1–1.0)
Monocytes Relative: 5 %
Neutro Abs: 5 10*3/uL (ref 1.7–7.7)
Neutrophils Relative %: 58 %
Platelets: 322 10*3/uL (ref 150–400)
RBC: 4.36 MIL/uL (ref 3.87–5.11)
RDW: 11.5 % (ref 11.5–15.5)
WBC: 8.6 10*3/uL (ref 4.0–10.5)
nRBC: 0 % (ref 0.0–0.2)

## 2023-03-16 LAB — COMPREHENSIVE METABOLIC PANEL
ALT: 20 U/L (ref 0–44)
AST: 19 U/L (ref 15–41)
Albumin: 4.2 g/dL (ref 3.5–5.0)
Alkaline Phosphatase: 57 U/L (ref 38–126)
Anion gap: 8 (ref 5–15)
BUN: 6 mg/dL (ref 6–20)
CO2: 27 mmol/L (ref 22–32)
Calcium: 9.3 mg/dL (ref 8.9–10.3)
Chloride: 104 mmol/L (ref 98–111)
Creatinine, Ser: 0.52 mg/dL (ref 0.44–1.00)
GFR, Estimated: >60 mL/min (ref >60)
Glucose, Bld: 81 mg/dL (ref 70–99)
Potassium: 4.1 mmol/L (ref 3.5–5.1)
Sodium: 139 mmol/L (ref 135–145)
Total Bilirubin: 0.5 mg/dL (ref 0.3–1.2)
Total Protein: 8.3 g/dL — ABNORMAL HIGH (ref 6.5–8.1)

## 2023-03-16 LAB — URINALYSIS, ROUTINE W REFLEX MICROSCOPIC
Bilirubin Urine: NEGATIVE
Glucose, UA: NEGATIVE mg/dL
Ketones, ur: 15 mg/dL — AB
Leukocytes,Ua: NEGATIVE
Nitrite: NEGATIVE
Protein, ur: NEGATIVE mg/dL
Specific Gravity, Urine: 1.01 (ref 1.005–1.030)
pH: 6 (ref 5.0–8.0)

## 2023-03-16 LAB — URINALYSIS, MICROSCOPIC (REFLEX)

## 2023-03-16 LAB — HCG, SERUM, QUALITATIVE: Preg, Serum: NEGATIVE

## 2023-03-16 LAB — PREGNANCY, URINE: Preg Test, Ur: NEGATIVE

## 2023-03-16 LAB — LIPASE, BLOOD: Lipase: 29 U/L (ref 11–51)

## 2023-03-16 MED ORDER — MORPHINE SULFATE (PF) 4 MG/ML IV SOLN
4.0000 mg | Freq: Once | INTRAVENOUS | Status: AC
  Administered 2023-03-16: 4 mg via INTRAVENOUS
  Filled 2023-03-16: qty 1

## 2023-03-16 MED ORDER — ONDANSETRON HCL 4 MG/2ML IJ SOLN
4.0000 mg | Freq: Once | INTRAMUSCULAR | Status: AC
  Administered 2023-03-16: 4 mg via INTRAVENOUS
  Filled 2023-03-16: qty 2

## 2023-03-16 MED ORDER — PIPERACILLIN-TAZOBACTAM 3.375 G IVPB 30 MIN
3.3750 g | Freq: Once | INTRAVENOUS | Status: AC
  Administered 2023-03-16: 3.375 g via INTRAVENOUS
  Filled 2023-03-16: qty 50

## 2023-03-16 MED ORDER — IOHEXOL 300 MG/ML  SOLN
100.0000 mL | Freq: Once | INTRAMUSCULAR | Status: AC | PRN
  Administered 2023-03-16: 100 mL via INTRAVENOUS

## 2023-03-16 MED ORDER — SODIUM CHLORIDE 0.9 % IV BOLUS
1000.0000 mL | Freq: Once | INTRAVENOUS | Status: AC
  Administered 2023-03-16: 1000 mL via INTRAVENOUS

## 2023-03-16 NOTE — ED Provider Notes (Signed)
[facility] Provider Note   CSN: [id number] Arrival date & time: 03/16/23  1612     History  Chief Complaint  Patient presents with   Abdominal Pain    Stacy Romero is a 42 y.o. female presents emergency department for evaluation of right upper quadrant abdominal pain that started on 03/08/23 but has worsened over the past two days.  She was seen by her primary care provider on Monday for pain and she was referred to a general surgeon due to history of  "gallbladder congestion" that was found on ultrasound in February 2024 and continued pain. She was provided zofran for her nausea. However, had an episode of vomiting following eating soup today. She denies recent ingestion of greasy or fatty foods and diarrhea.   Reports that she has an appointment with surgery on October 3 but was unable to make it to that appointment secondary to pain.    She reports that she has been constipated for the past 2 weeks only having 1 bowel movement a week.  Last BM was 9/23 and consisted of "4 hard pebbles".   The history is provided by the patient.  Abdominal Pain Associated symptoms: constipation and vomiting   Associated symptoms: no chest pain, no cough, no dysuria, no fever, no hematuria and no shortness of breath        Home Medications Prior to Admission medications   Medication Sig Start Date End Date Taking? Authorizing Provider  medroxyPROGESTERone (DEPO-PROVERA) 150 MG/ML injection Inject 1 mL (150 mg total) into the muscle every 3 (three) months. 03/20/21   Genia Del, MD  Prenatal Vit-Fe Fumarate-FA (PRENATAL VITAMIN PO) Take by mouth.    [provider]  VITAMIN D PO Take by mouth.    [provider]      Allergies    Patient has no known allergies.    Review of Systems   Review of Systems  Constitutional:  Negative for fever.  Respiratory:  Negative for cough and shortness of breath.    Cardiovascular:  Negative for chest pain.  Gastrointestinal:  Positive for abdominal pain, constipation and vomiting.  Genitourinary:  Negative for difficulty urinating, dysuria, flank pain and hematuria.    Physical Exam Updated Vital Signs BP 121/84 (BP Location: Left Arm)   Pulse 83   Temp 98.3 F (36.8 C)   Resp 15   Ht 5' (1.524 m)   Wt 60.3 kg   LMP 02/23/2023 (Exact Date)   SpO2 100%   BMI 25.97 kg/m  Physical Exam Vitals and nursing note reviewed.  Constitutional:      General: She is not in acute distress.    Appearance: Normal appearance. She is not ill-appearing.  HENT:     Head: Normocephalic and atraumatic.  Eyes:     Conjunctiva/sclera: Conjunctivae normal.  Cardiovascular:     Rate and Rhythm: Normal rate.  Pulmonary:     Effort: Pulmonary effort is normal. No respiratory distress.  Abdominal:     Palpations: Abdomen is soft.     Tenderness: There is abdominal tenderness in the right upper quadrant. There is no guarding.  Skin:    Coloration: Skin is not jaundiced or pale.  Neurological:     Mental Status: She is alert. Mental status is at baseline.     ED Results / Procedures / Treatments   Labs (all labs ordered are listed, but only abnormal results are displayed) Labs Reviewed  COMPREHENSIVE METABOLIC PANEL -  Abnormal; Notable for the following components:      Result Value   Total Protein 8.3 (*)    All other components within normal limits  URINALYSIS, ROUTINE W REFLEX MICROSCOPIC - Abnormal; Notable for the following components:   Hgb urine dipstick TRACE (*)    Ketones, ur 15 (*)    All other components within normal limits  URINALYSIS, MICROSCOPIC (REFLEX) - Abnormal; Notable for the following components:   Bacteria, UA RARE (*)    All other components within normal limits  CBC WITH DIFFERENTIAL/PLATELET  LIPASE, BLOOD  HCG, SERUM, QUALITATIVE  PREGNANCY, URINE    EKG EKG Interpretation Date/Time:  Wednesday March 16 2023  19:52:15 EDT Ventricular Rate:  72 PR Interval:  152 QRS Duration:  101 QT Interval:  401 QTC Calculation: 439 R Axis:   76  Text Interpretation: Sinus rhythm Confirmed by Vivi Barrack [phone]) on 03/16/2023 10:17:46 PM  Radiology CT ABDOMEN PELVIS W CONTRAST  Result Date: 03/16/2023 CLINICAL DATA:  Acute abdominal pain and right upper quadrant pain. EXAM: CT ABDOMEN AND PELVIS WITH CONTRAST TECHNIQUE: Multidetector CT imaging of the abdomen and pelvis was performed using the standard protocol following bolus administration of intravenous contrast. RADIATION DOSE REDUCTION: This exam was performed according to the departmental dose-optimization program which includes automated exposure control, adjustment of the mA and/or kV according to patient size and/or use of iterative reconstruction technique. CONTRAST:  OMNIPAQUE IOHEXOL 300 MG/ML  SOLN COMPARISON:  None Available. FINDINGS: Lower chest: No acute abnormality. Hepatobiliary: There is diffuse gallbladder wall thickening. Gallbladder is distended and gallstones are present. There is no biliary ductal dilatation. The liver is within normal limits. Pancreas: Unremarkable. No pancreatic ductal dilatation or surrounding inflammatory changes. Spleen: Normal in size without focal abnormality. Adrenals/Urinary Tract: Adrenal glands are unremarkable. Kidneys are normal, without renal calculi, focal lesion, or hydronephrosis. Bladder is unremarkable. Stomach/Bowel: Stomach is within normal limits. Appendix appears normal. No evidence of bowel wall thickening, distention, or inflammatory changes. Vascular/Lymphatic: No significant vascular findings are present. No enlarged abdominal or pelvic lymph nodes. Reproductive: Uterus and bilateral adnexa are unremarkable. Other: There is trace free fluid in the pelvis. Musculoskeletal: No acute or significant osseous findings. IMPRESSION: 1. Findings worrisome for acute cholecystitis please correlate clinically.  2. Trace free fluid in the pelvis. Electronically Signed   By: Darliss Cheney M.D.   On: 03/16/2023 19:07    Procedures Procedures    Medications Ordered in ED Medications  morphine (PF) 4 MG/ML injection 4 mg (4 mg Intravenous Given 03/16/23 1723)  ondansetron (ZOFRAN) injection 4 mg (4 mg Intravenous Given 03/16/23 1723)  sodium chloride 0.9 % bolus 1,000 mL (0 mLs Intravenous Stopped 03/16/23 1913)  iohexol (OMNIPAQUE) 300 MG/ML solution 100 mL (100 mLs Intravenous Contrast Given 03/16/23 1709)  piperacillin-tazobactam (ZOSYN) IVPB 3.375 g (0 g Intravenous Stopped 03/16/23 2009)    ED Course/ Medical Decision Making/ A&P Clinical Course as of 03/16/23 2258  Wed Mar 16, 2023  1924 Admitted to Dr. Carolynne Edouard, will be transferred to [facility] [[facility]]    Clinical Course User Index [HN] Loetta Rough, MD                                 Medical Decision Making Amount and/or Complexity of Data Reviewed Labs: ordered. Radiology: ordered.  Risk Prescription drug management. Decision regarding hospitalization.    Patient presents to the ED for concern of worsening abdominal  pain over the past 2 days with an episode of vomiting, this involves an extensive number of treatment options, and is a complaint that carries with it a high risk of complications and morbidity.  The differential diagnosis includes acute cholecystitis, ascending cholangitis, choledocholithiasis, pancreatitis, GERD, appendicitis  Co morbidities that complicate the patient evaluation  None   Lab Tests:  I Ordered, and personally interpreted labs.  The pertinent results include: Negative hCG, UA negative for infection, negative lipase, negative leukocytosis   Imaging Studies ordered:  I ordered imaging studies including CT abdomen pelvis with contrast I independently visualized and interpreted imaging which showed acute cholecystitis I agree with the radiologist interpretation   Cardiac Monitoring:  The patient was  maintained on a cardiac monitor.  I personally viewed and interpreted the cardiac monitored which showed an underlying rhythm of: NSR with no signs of ischemia   Medicines ordered and prescription drug management:  I ordered medication including morphine for right upper quadrant pain and Zofran for nausea.  Reevaluation of the patient after these medicines showed that the patient improved I have reviewed the patients home medicines and have made adjustments as needed Zosyn initiated IV in ED for acute cholecystitis   Consultations Obtained:  Dr. Jearld Fenton consulted general surgery for acute cholecystitis.  Dr. Carolynne Edouard accepted patient.   Problem List / ED Course:  Acute cholecystitis   Reevaluation:  After the interventions noted above, I reevaluated the patient and found that they have :improved    Dispostion:  After consideration of the diagnostic results and the patients response to treatment, I feel that the patent would benefit from admission to general surgery.   Upon evaluation, patient is resting comfortably in bed.  She is not ill-appearing or in signs of acute distress.  See HPI  Will provide Zofran and morphine improve pain and nausea.  CT scan significant for acute cholecystitis, diffuse gallbladder wall thickening, distended gallbladder with gallstones.  Will provide 1 dose of IV Zosyn and NS in ED. I have discussed lab work, CT findings, and admission with patient who expresses understanding and agrees with treatment plan. Dr. Carolynne Edouard (general surgery) accepts patient for admission.        Final Clinical Impression(s) / ED Diagnoses Final diagnoses:  Cholecystitis    Rx / DC Orders ED Discharge Orders     None         Judithann Sheen, PA 03/16/23 2259    Loetta Rough, MD 03/16/23 [phone]

## 2023-03-16 NOTE — ED Triage Notes (Addendum)
Pt states she has been having rt. Upper quadrant pain x 8 days.  Saw her PCP on Monday and suggested she f/u with surgeon because she has had some gallbladder issues in the past. N/V

## 2023-03-16 NOTE — ED Notes (Signed)
[facility] transport called will arrive in 15 mins to transport patient to [facility].  Called @ 21:53

## 2023-03-16 NOTE — ED Notes (Signed)
[facility] called for transport  No Current ETA.Jeanene Erb @ 20:27

## 2023-03-17 ENCOUNTER — Observation Stay (HOSPITAL_COMMUNITY): Payer: BC Managed Care – PPO

## 2023-03-17 ENCOUNTER — Other Ambulatory Visit (HOSPITAL_COMMUNITY): Payer: Self-pay

## 2023-03-17 ENCOUNTER — Other Ambulatory Visit: Payer: Self-pay

## 2023-03-17 ENCOUNTER — Encounter (HOSPITAL_COMMUNITY)
Admission: EM | Disposition: A | Discharge status: Home / Self Care | Payer: Self-pay | Source: Home / Self Care | Attending: Emergency Medicine

## 2023-03-17 ENCOUNTER — Observation Stay (HOSPITAL_COMMUNITY): Payer: BC Managed Care – PPO | Admitting: Certified Registered Nurse Anesthetist

## 2023-03-17 HISTORY — PX: CHOLECYSTECTOMY: SHX55

## 2023-03-17 SURGERY — LAPAROSCOPIC CHOLECYSTECTOMY WITH INTRAOPERATIVE CHOLANGIOGRAM
Anesthesia: General

## 2023-03-17 MED ORDER — MORPHINE SULFATE (PF) 2 MG/ML IV SOLN: 2.0000 mg | INTRAVENOUS | Status: DC | PRN

## 2023-03-17 MED ORDER — LACTATED RINGERS IV SOLN: INTRAVENOUS | Status: DC

## 2023-03-17 MED ORDER — FENTANYL CITRATE (PF) 100 MCG/2ML IJ SOLN
INTRAMUSCULAR | Status: AC
  Filled 2023-03-17: qty 2

## 2023-03-17 MED ORDER — KETOROLAC TROMETHAMINE 30 MG/ML IJ SOLN
INTRAMUSCULAR | Status: AC
  Filled 2023-03-17: qty 1

## 2023-03-17 MED ORDER — DEXAMETHASONE SODIUM PHOSPHATE 4 MG/ML IJ SOLN
INTRAMUSCULAR | Status: DC | PRN
  Administered 2023-03-17: 10 mg via INTRAVENOUS

## 2023-03-17 MED ORDER — ONDANSETRON HCL 4 MG/2ML IJ SOLN
INTRAMUSCULAR | Status: AC
  Filled 2023-03-17: qty 2

## 2023-03-17 MED ORDER — FENTANYL CITRATE (PF) 250 MCG/5ML IJ SOLN
INTRAMUSCULAR | Status: AC
  Filled 2023-03-17: qty 5

## 2023-03-17 MED ORDER — AMISULPRIDE (ANTIEMETIC) 5 MG/2ML IV SOLN: 10.0000 mg | Freq: Once | INTRAVENOUS | Status: DC | PRN

## 2023-03-17 MED ORDER — HEMOSTATIC AGENTS (NO CHARGE) OPTIME
TOPICAL | Status: DC | PRN
  Administered 2023-03-17: 1 via TOPICAL

## 2023-03-17 MED ORDER — KETOROLAC TROMETHAMINE 30 MG/ML IJ SOLN
INTRAMUSCULAR | Status: DC | PRN
Start: 2023-03-17 — End: 2023-03-17
  Administered 2023-03-17: 30 mg via INTRAVENOUS

## 2023-03-17 MED ORDER — CEFAZOLIN SODIUM-DEXTROSE 2-4 GM/100ML-% IV SOLN
INTRAVENOUS | Status: AC
  Filled 2023-03-17: qty 100

## 2023-03-17 MED ORDER — OXYCODONE HCL 5 MG PO TABS: 5.0000 mg | ORAL_TABLET | Freq: Once | ORAL | Status: DC | PRN

## 2023-03-17 MED ORDER — ROCURONIUM BROMIDE 10 MG/ML (PF) SYRINGE
PREFILLED_SYRINGE | INTRAVENOUS | Status: AC
  Filled 2023-03-17: qty 10

## 2023-03-17 MED ORDER — ONDANSETRON HCL 4 MG/2ML IJ SOLN: 4.0000 mg | Freq: Four times a day (QID) | INTRAMUSCULAR | Status: DC | PRN

## 2023-03-17 MED ORDER — LIDOCAINE HCL (PF) 2 % IJ SOLN
INTRAMUSCULAR | Status: AC
  Filled 2023-03-17: qty 5

## 2023-03-17 MED ORDER — FENTANYL CITRATE PF 50 MCG/ML IJ SOSY: 25.0000 ug | PREFILLED_SYRINGE | INTRAMUSCULAR | Status: DC | PRN

## 2023-03-17 MED ORDER — DIPHENHYDRAMINE HCL 50 MG/ML IJ SOLN
INTRAMUSCULAR | Status: AC
  Filled 2023-03-17: qty 1

## 2023-03-17 MED ORDER — SUGAMMADEX SODIUM 200 MG/2ML IV SOLN
INTRAVENOUS | Status: DC | PRN
  Administered 2023-03-17: 200 mg via INTRAVENOUS

## 2023-03-17 MED ORDER — ACETAMINOPHEN 10 MG/ML IV SOLN: 1000.0000 mg | Freq: Once | INTRAVENOUS | Status: DC | PRN

## 2023-03-17 MED ORDER — SODIUM CHLORIDE 0.9 % IV SOLN: INTRAVENOUS | Status: DC

## 2023-03-17 MED ORDER — OXYCODONE HCL 5 MG/5ML PO SOLN: 5.0000 mg | Freq: Once | ORAL | Status: DC | PRN

## 2023-03-17 MED ORDER — OXYCODONE HCL 5 MG PO TABS: 5.0000 mg | ORAL_TABLET | ORAL | Status: DC | PRN

## 2023-03-17 MED ORDER — DEXAMETHASONE SODIUM PHOSPHATE 10 MG/ML IJ SOLN
INTRAMUSCULAR | Status: AC
  Filled 2023-03-17: qty 1

## 2023-03-17 MED ORDER — PROPOFOL 10 MG/ML IV BOLUS
INTRAVENOUS | Status: DC | PRN
  Administered 2023-03-17: 200 mg via INTRAVENOUS

## 2023-03-17 MED ORDER — MORPHINE SULFATE (PF) 2 MG/ML IV SOLN
1.0000 mg | INTRAVENOUS | Status: DC | PRN
  Administered 2023-03-17: 2 mg via INTRAVENOUS
  Filled 2023-03-17: qty 1

## 2023-03-17 MED ORDER — MIDAZOLAM HCL 5 MG/5ML IJ SOLN
INTRAMUSCULAR | Status: DC | PRN
  Administered 2023-03-17: 2 mg via INTRAVENOUS

## 2023-03-17 MED ORDER — FENTANYL CITRATE (PF) 100 MCG/2ML IJ SOLN
INTRAMUSCULAR | Status: DC | PRN
  Administered 2023-03-17 (×2): 50 ug via INTRAVENOUS
  Administered 2023-03-17 (×2): 100 ug via INTRAVENOUS
  Administered 2023-03-17: 50 ug via INTRAVENOUS

## 2023-03-17 MED ORDER — BUPIVACAINE HCL (PF) 0.5 % IJ SOLN
INTRAMUSCULAR | Status: AC
  Filled 2023-03-17: qty 30

## 2023-03-17 MED ORDER — ORAL CARE MOUTH RINSE: 15.0000 mL | Freq: Once | OROMUCOSAL | Status: AC

## 2023-03-17 MED ORDER — ONDANSETRON HCL 4 MG/2ML IJ SOLN
INTRAMUSCULAR | Status: DC | PRN
  Administered 2023-03-17: 4 mg via INTRAVENOUS

## 2023-03-17 MED ORDER — DIPHENHYDRAMINE HCL 50 MG/ML IJ SOLN
12.5000 mg | Freq: Once | INTRAMUSCULAR | Status: AC
  Administered 2023-03-17: 12.5 mg via INTRAVENOUS

## 2023-03-17 MED ORDER — OXYCODONE HCL 5 MG PO TABS
5.0000 mg | ORAL_TABLET | Freq: Four times a day (QID) | ORAL | 0 refills | Status: AC | PRN
  Filled 2023-03-17: qty 15, 4d supply, fill #0

## 2023-03-17 MED ORDER — ACETAMINOPHEN 500 MG PO TABS
1000.0000 mg | ORAL_TABLET | Freq: Four times a day (QID) | ORAL | Status: DC
  Administered 2023-03-17: 1000 mg via ORAL
  Filled 2023-03-17: qty 2

## 2023-03-17 MED ORDER — ACETAMINOPHEN 500 MG PO TABS: 1000.0000 mg | ORAL_TABLET | Freq: Four times a day (QID) | ORAL | Status: AC | PRN

## 2023-03-17 MED ORDER — ROCURONIUM BROMIDE 10 MG/ML (PF) SYRINGE
PREFILLED_SYRINGE | INTRAVENOUS | Status: DC | PRN
  Administered 2023-03-17: 50 mg via INTRAVENOUS

## 2023-03-17 MED ORDER — LACTATED RINGERS IR SOLN
Status: DC | PRN
Start: 2023-03-17 — End: 2023-03-17
  Administered 2023-03-17: 1000 mL

## 2023-03-17 MED ORDER — 0.9 % SODIUM CHLORIDE (POUR BTL) OPTIME
TOPICAL | Status: DC | PRN
Start: 2023-03-17 — End: 2023-03-17
  Administered 2023-03-17: 1000 mL

## 2023-03-17 MED ORDER — CHLORHEXIDINE GLUCONATE 0.12 % MT SOLN
15.0000 mL | Freq: Once | OROMUCOSAL | Status: AC
  Administered 2023-03-17: 15 mL via OROMUCOSAL

## 2023-03-17 MED ORDER — PROPOFOL 10 MG/ML IV BOLUS
INTRAVENOUS | Status: AC
  Filled 2023-03-17: qty 20

## 2023-03-17 MED ORDER — LIDOCAINE 2% (20 MG/ML) 5 ML SYRINGE
INTRAMUSCULAR | Status: DC | PRN
  Administered 2023-03-17: 80 mg via INTRAVENOUS

## 2023-03-17 MED ORDER — AMOXICILLIN-POT CLAVULANATE 875-125 MG PO TABS
1.0000 | ORAL_TABLET | Freq: Two times a day (BID) | ORAL | 0 refills | Status: AC
  Filled 2023-03-17: qty 10, 5d supply, fill #0

## 2023-03-17 MED ORDER — BUPIVACAINE HCL (PF) 0.5 % IJ SOLN
INTRAMUSCULAR | Status: DC | PRN
  Administered 2023-03-17: 20 mL

## 2023-03-17 MED ORDER — CEFAZOLIN SODIUM-DEXTROSE 2-4 GM/100ML-% IV SOLN
2.0000 g | Freq: Once | INTRAVENOUS | Status: AC
  Administered 2023-03-17: 2 g via INTRAVENOUS

## 2023-03-17 MED ORDER — MIDAZOLAM HCL 2 MG/2ML IJ SOLN
INTRAMUSCULAR | Status: AC
  Filled 2023-03-17: qty 2

## 2023-03-17 SURGICAL SUPPLY — 36 items
ADH SKN CLS APL DERMABOND .7 (GAUZE/BANDAGES/DRESSINGS) ×1
APL PRP STRL LF DISP 70% ISPRP (MISCELLANEOUS) ×1
APPLIER CLIP 5 13 M/L LIGAMAX5 (MISCELLANEOUS) ×1
APR CLP MED LRG 5 ANG JAW (MISCELLANEOUS) ×1
BAG COUNTER SPONGE SURGICOUNT (BAG) IMPLANT
BAG SPNG CNTER NS LX DISP (BAG)
CABLE HIGH FREQUENCY MONO STRZ (ELECTRODE) ×2 IMPLANT
CHLORAPREP W/TINT 26 (MISCELLANEOUS) ×2 IMPLANT
CLIP APPLIE 5 13 M/L LIGAMAX5 (MISCELLANEOUS) ×2 IMPLANT
COVER MAYO STAND XLG (MISCELLANEOUS) ×2 IMPLANT
DERMABOND ADVANCED .7 DNX12 (GAUZE/BANDAGES/DRESSINGS) ×2 IMPLANT
DRAPE C-ARM 42X120 X-RAY (DRAPES) IMPLANT
ELECT REM PT RETURN 15FT ADLT (MISCELLANEOUS) ×2 IMPLANT
GLOVE BIO SURGEON STRL SZ7.5 (GLOVE) ×2 IMPLANT
GOWN STRL REUS W/ TWL XL LVL3 (GOWN DISPOSABLE) ×4 IMPLANT
GOWN STRL REUS W/TWL XL LVL3 (GOWN DISPOSABLE) ×2
HEMOSTAT SNOW SURGICEL 2X4 (HEMOSTASIS) IMPLANT
HEMOSTAT SURGICEL 4X8 (HEMOSTASIS) IMPLANT
IRRIG SUCT STRYKERFLOW 2 WTIP (MISCELLANEOUS) ×1
IRRIGATION SUCT STRKRFLW 2 WTP (MISCELLANEOUS) ×2 IMPLANT
KIT BASIN OR (CUSTOM PROCEDURE TRAY) ×2 IMPLANT
KIT TURNOVER KIT A (KITS) IMPLANT
PENCIL SMOKE EVACUATOR (MISCELLANEOUS) IMPLANT
SCISSORS LAP 5X35 DISP (ENDOMECHANICALS) ×2 IMPLANT
SET CHOLANGIOGRAPH MIX (MISCELLANEOUS) IMPLANT
SET TUBE SMOKE EVAC HIGH FLOW (TUBING) ×2 IMPLANT
SLEEVE Z-THREAD 5X100MM (TROCAR) ×4 IMPLANT
SPIKE FLUID TRANSFER (MISCELLANEOUS) ×2 IMPLANT
SUT MNCRL AB 4-0 PS2 18 (SUTURE) ×2 IMPLANT
SYS BAG RETRIEVAL 10MM (BASKET) ×1
SYSTEM BAG RETRIEVAL 10MM (BASKET) ×2 IMPLANT
TOWEL OR 17X26 10 PK STRL BLUE (TOWEL DISPOSABLE) ×2 IMPLANT
TOWEL OR NON WOVEN STRL DISP B (DISPOSABLE) ×2 IMPLANT
TRAY LAPAROSCOPIC (CUSTOM PROCEDURE TRAY) ×2 IMPLANT
TROCAR BALLN 12MMX100 BLUNT (TROCAR) ×2 IMPLANT
TROCAR Z-THREAD OPTICAL 5X100M (TROCAR) ×2 IMPLANT

## 2023-03-17 NOTE — Progress Notes (Signed)
Transition of Care [facility]) - Inpatient Brief Assessment   Patient Details  Name: Stacy Romero MRN: [id number] Date of Birth: [birth year]  Transition of Care [facility]) CM/SW Contact:    Adrian Prows, RN Phone Number: 03/17/2023, 3:47 PM   Clinical Narrative: Sherron Monday w/ pt's husband Ina Homes. Brief TOC assessment completed. Transition of Care [facility]) - Inpatient Brief Assessment   Patient Details  Name: Stacy Romero MRN: [id number] Date of Birth: [birth year]  Transition of Care [facility]) CM/SW Contact:    Adrian Prows, RN Phone Number: 03/17/2023, 3:48 PM   Clinical Narrative:    Transition of Care Asessment: Insurance and Status: Insurance coverage has been reviewed Patient has primary care physician: Yes Home environment has been reviewed: yes Prior level of function:: independent Prior/Current Home Services: No current home services Social Determinants of Health Reivew: SDOH reviewed no interventions necessary Readmission risk has been reviewed: Yes Transition of care needs: no transition of care needs at this time    Transition of Care Asessment: Insurance and Status: Insurance coverage has been reviewed Patient has primary care physician: Yes Home environment has been reviewed: yes Prior level of function:: independent Prior/Current Home Services: No current home services Social Determinants of Health Reivew: SDOH reviewed no interventions necessary Readmission risk has been reviewed: Yes Transition of care needs: no transition of care needs at this time

## 2023-03-17 NOTE — Progress Notes (Signed)
   03/17/23 0051  Provider Notification  Provider Name/Title P. Carolynne Edouard, MD (General Surgery)  Date Provider Notified 03/17/23  Time Provider Notified [phone]  Method of Notification Page  Notification Reason Requested by patient/family  Provider response See new orders (Verbal orders for morphine PRN, Zofran PRN, and NS IV fluids. No other orders at this time.)  Date of Provider Response 03/17/23  Time of Provider Response [phone]

## 2023-03-17 NOTE — Anesthesia Postprocedure Evaluation (Signed)
Anesthesia Post Note  Patient: Stacy Romero  Procedure(s) Performed: LAPAROSCOPIC CHOLECYSTECTOMY     Patient location during evaluation: PACU Anesthesia Type: General Level of consciousness: awake Pain management: pain level controlled Vital Signs Assessment: post-procedure vital signs reviewed and stable Respiratory status: spontaneous breathing, nonlabored ventilation and respiratory function stable Cardiovascular status: blood pressure returned to baseline and stable Postop Assessment: no apparent nausea or vomiting Anesthetic complications: no   No notable events documented.  Last Vitals:  Vitals:   03/17/23 1315 03/17/23 1340  BP: (!) 141/101 (!) 166/98  Pulse: 69 74  Resp: 13 18  Temp: 36.5 C 36.5 C  SpO2: 99% 98%    Last Pain:  Vitals:   03/17/23 1343  TempSrc:   PainSc: 1                  [name]

## 2023-03-17 NOTE — Progress Notes (Signed)
Patient was given discharge instructions from [facility], and all questions were answered. Patient was stable for discharge and was taken to the main exit by wheelchair after medications were delivered.

## 2023-03-17 NOTE — H&P (Signed)
CC: RUQ abdominal pain  HPI: Stacy Romero is an 42 y.o. female who was transferred from med [facility] for evaluation of cholecystitis.  She apparently has had known gallstones and was supposed to come to our office but she presented to the ER with worsening right upper quadrant abdominal pain.  Her pain actually started on September 17 and has been worse over the past several days.  Has had some nausea and vomiting.  Her discomfort is worse with fatty foods.  Pain was moderate to severe but she feels better now this morning.  Denies jaundice.  She is otherwise healthy and without complaints.  She has been constipated  Past Medical History:  Diagnosis Date   Anxiety    ASCUS (atypical squamous cells of undetermined significance) on Pap smear    NEG. HR HPV ON 09/2008   Bilateral ovarian cysts    CIN III (cervical intraepithelial neoplasia grade III) with severe dysplasia 12/2011   LEEP [facility]    IUD (intrauterine device) in place 01/19/2011   Paraguard   Pregnancy induced hypertension    Preterm labor    Vaginal delivery    ONE NSVD   Vaginal Pap smear, abnormal     Past Surgical History:  Procedure Laterality Date   HYSTEROSCOPY N/A 04/11/2017   Procedure: HYSTEROSCOPY;  Surgeon: Genia Del, MD;  Location: [facility];  Service: Gynecology;  Laterality: N/A;  Hysteroscopic Removal of IUD  needs 30 minutes  requests 5:00pm   INTRAUTERINE DEVICE (IUD) INSERTION  9/13   Mirena due out 9/18    Family History  Problem Relation Age of Onset   Diabetes Father    Hypertension Father     Social:  reports that she has never smoked. She has never used smokeless tobacco. She reports current alcohol use. She reports that she does not use drugs.  Allergies: No Known Allergies  Medications: I have reviewed the patient's current medications.  Results for orders placed or performed during the hospital encounter of 03/16/23 (from the past 48 hour(s))  CBC with  Differential     Status: None   Collection Time: 03/16/23  4:26 PM  Result Value Ref Range   WBC 8.6 4.0 - 10.5 K/uL   RBC 4.36 3.87 - 5.11 MIL/uL   Hemoglobin 13.2 12.0 - 15.0 g/dL   HCT 40.9 81.1 - 91.4 %   MCV 89.9 80.0 - 100.0 fL   MCH 30.3 26.0 - 34.0 pg   MCHC 33.7 30.0 - 36.0 g/dL   RDW 78.2 95.6 - 21.3 %   Platelets 322 150 - 400 K/uL   nRBC 0.0 0.0 - 0.2 %   Neutrophils Relative % 58 %   Neutro Abs 5.0 1.7 - 7.7 K/uL   Lymphocytes Relative 33 %   Lymphs Abs 2.8 0.7 - 4.0 K/uL   Monocytes Relative 5 %   Monocytes Absolute 0.4 0.1 - 1.0 K/uL   Eosinophils Relative 3 %   Eosinophils Absolute 0.3 0.0 - 0.5 K/uL   Basophils Relative 1 %   Basophils Absolute 0.1 0.0 - 0.1 K/uL   Immature Granulocytes 0 %   Abs Immature Granulocytes 0.02 0.00 - 0.07 K/uL    Comment: Performed at [facility], [address] [place], [place] [address]  Comprehensive metabolic panel     Status: Abnormal   Collection Time: 03/16/23  4:26 PM  Result Value Ref Range   Sodium 139 135 - 145 mmol/L   Potassium  4.1 3.5 - 5.1 mmol/L   Chloride 104 98 - 111 mmol/L   CO2 27 22 - 32 mmol/L   Glucose, Bld 81 70 - 99 mg/dL    Comment: Glucose reference range applies only to samples taken after fasting for at least 8 hours.   BUN 6 6 - 20 mg/dL   Creatinine, Ser 1.61 0.44 - 1.00 mg/dL   Calcium 9.3 8.9 - 09.6 mg/dL   Total Protein 8.3 (H) 6.5 - 8.1 g/dL   Albumin 4.2 3.5 - 5.0 g/dL   AST 19 15 - 41 U/L   ALT 20 0 - 44 U/L   Alkaline Phosphatase 57 38 - 126 U/L   Total Bilirubin 0.5 0.3 - 1.2 mg/dL   GFR, Estimated >04 >54 mL/min    Comment: (NOTE) Calculated using the CKD-EPI Creatinine Equation (2021)    Anion gap 8 5 - 15    Comment: Performed at [facility], [address] [place], [place] [address]  Lipase, blood     Status: None   Collection Time: 03/16/23  4:26 PM  Result Value Ref Range   Lipase 29 11 - 51 U/L    Comment: Performed at [facility], [address] [place], [place] [address]  Urinalysis, Routine w reflex microscopic -Urine, Clean Catch     Status: Abnormal   Collection Time: 03/16/23  4:26 PM  Result Value Ref Range   Color, Urine YELLOW YELLOW   APPearance CLEAR CLEAR   Specific Gravity, Urine 1.010 1.005 - 1.030   pH 6.0 5.0 - 8.0   Glucose, UA NEGATIVE NEGATIVE mg/dL   Hgb urine dipstick TRACE (A) NEGATIVE   Bilirubin Urine NEGATIVE NEGATIVE   Ketones, ur 15 (A) NEGATIVE mg/dL   Protein, ur NEGATIVE NEGATIVE mg/dL   Nitrite NEGATIVE NEGATIVE   Leukocytes,Ua NEGATIVE NEGATIVE    Comment: Performed at [facility], [address] [place], [place] [address]  hCG, serum, qualitative     Status: None   Collection Time: 03/16/23  4:26 PM  Result Value Ref Range   Preg, Serum NEGATIVE NEGATIVE    Comment:        THE SENSITIVITY OF THIS METHODOLOGY IS >10 mIU/mL. Performed at [facility], [address] [place], [place] [address]   Pregnancy, urine     Status: None   Collection Time: 03/16/23  4:26 PM  Result Value Ref Range   Preg Test, Ur NEGATIVE NEGATIVE    Comment:        THE SENSITIVITY OF THIS METHODOLOGY IS >25 mIU/mL. Performed at [facility], [address] [place], [place] [address]   Urinalysis, Microscopic (reflex)     Status: Abnormal   Collection Time: 03/16/23  4:26 PM  Result Value Ref Range   RBC / HPF 0-5 0 - 5 RBC/hpf   WBC, UA 0-5 0 - 5 WBC/hpf   Bacteria, UA RARE (A) NONE SEEN   Squamous Epithelial / HPF 0-5 0 - 5 /HPF    Comment: Performed at [facility], [address] [place], [place] [address]    CT ABDOMEN PELVIS W CONTRAST  Result Date: 03/16/2023 CLINICAL DATA:  Acute abdominal pain and right upper quadrant pain. EXAM: CT ABDOMEN AND PELVIS WITH CONTRAST TECHNIQUE: Multidetector CT imaging of the abdomen and pelvis was performed using the standard protocol following bolus administration of intravenous contrast. RADIATION DOSE  REDUCTION:  This exam was performed according to the departmental dose-optimization program which includes automated exposure control, adjustment of the mA and/or kV according to patient size and/or use of iterative reconstruction technique. CONTRAST:  OMNIPAQUE IOHEXOL 300 MG/ML  SOLN COMPARISON:  None Available. FINDINGS: Lower chest: No acute abnormality. Hepatobiliary: There is diffuse gallbladder wall thickening. Gallbladder is distended and gallstones are present. There is no biliary ductal dilatation. The liver is within normal limits. Pancreas: Unremarkable. No pancreatic ductal dilatation or surrounding inflammatory changes. Spleen: Normal in size without focal abnormality. Adrenals/Urinary Tract: Adrenal glands are unremarkable. Kidneys are normal, without renal calculi, focal lesion, or hydronephrosis. Bladder is unremarkable. Stomach/Bowel: Stomach is within normal limits. Appendix appears normal. No evidence of bowel wall thickening, distention, or inflammatory changes. Vascular/Lymphatic: No significant vascular findings are present. No enlarged abdominal or pelvic lymph nodes. Reproductive: Uterus and bilateral adnexa are unremarkable. Other: There is trace free fluid in the pelvis. Musculoskeletal: No acute or significant osseous findings. IMPRESSION: 1. Findings worrisome for acute cholecystitis please correlate clinically. 2. Trace free fluid in the pelvis. Electronically Signed   By: Darliss Cheney M.D.   On: 03/16/2023 19:07    ROS - all of the below systems have been reviewed with the patient and positives are indicated with bold text General: chills, fever or night sweats Eyes: blurry vision or double vision ENT: epistaxis or sore throat Allergy/Immunology: itchy/watery eyes or nasal congestion Hematologic/Lymphatic: bleeding problems, blood clots or swollen lymph nodes Endocrine: temperature intolerance or unexpected weight changes Breast: new or changing breast lumps or nipple  discharge Resp: cough, shortness of breath, or wheezing CV: chest pain or dyspnea on exertion GI: as per HPI GU: dysuria, trouble voiding, or hematuria MSK: joint pain or joint stiffness Neuro: TIA or stroke symptoms Derm: pruritus and skin lesion changes Psych: anxiety and depression  PE Blood pressure 133/86, pulse 70, temperature 98.5 F (36.9 C), temperature source Oral, resp. rate 17, height 5' (1.524 m), weight 60.3 kg, last menstrual period 02/23/2023, SpO2 100%. Constitutional: NAD; conversant; no deformities Eyes: Moist conjunctiva; no lid lag; anicteric; PERRL Neck: Trachea midline; no thyromegaly Lungs: Normal respiratory effort; no tactile fremitus CV: RRR; no palpable thrills; no pitting edema GI: Abd soft with mild tenderness and guarding in the right upper quadrant; no palpable hepatosplenomegaly MSK: Normal range of motion of extremities; no clubbing/cyanosis Psychiatric: Appropriate affect; alert and oriented x3   Results for orders placed or performed during the hospital encounter of 03/16/23 (from the past 48 hour(s))  CBC with Differential     Status: None   Collection Time: 03/16/23  4:26 PM  Result Value Ref Range   WBC 8.6 4.0 - 10.5 K/uL   RBC 4.36 3.87 - 5.11 MIL/uL   Hemoglobin 13.2 12.0 - 15.0 g/dL   HCT 53.6 64.4 - 03.4 %   MCV 89.9 80.0 - 100.0 fL   MCH 30.3 26.0 - 34.0 pg   MCHC 33.7 30.0 - 36.0 g/dL   RDW 74.2 59.5 - 63.8 %   Platelets 322 150 - 400 K/uL   nRBC 0.0 0.0 - 0.2 %   Neutrophils Relative % 58 %   Neutro Abs 5.0 1.7 - 7.7 K/uL   Lymphocytes Relative 33 %   Lymphs Abs 2.8 0.7 - 4.0 K/uL   Monocytes Relative 5 %   Monocytes Absolute 0.4 0.1 - 1.0 K/uL   Eosinophils Relative 3 %   Eosinophils Absolute 0.3 0.0 - 0.5 K/uL   Basophils Relative 1 %  Basophils Absolute 0.1 0.0 - 0.1 K/uL   Immature Granulocytes 0 %   Abs Immature Granulocytes 0.02 0.00 - 0.07 K/uL    Comment: Performed at [facility], [address] [place], [place] [address]  Comprehensive metabolic panel     Status: Abnormal   Collection Time: 03/16/23  4:26 PM  Result Value Ref Range   Sodium 139 135 - 145 mmol/L   Potassium 4.1 3.5 - 5.1 mmol/L   Chloride 104 98 - 111 mmol/L   CO2 27 22 - 32 mmol/L   Glucose, Bld 81 70 - 99 mg/dL    Comment: Glucose reference range applies only to samples taken after fasting for at least 8 hours.   BUN 6 6 - 20 mg/dL   Creatinine, Ser 6.04 0.44 - 1.00 mg/dL   Calcium 9.3 8.9 - 54.0 mg/dL   Total Protein 8.3 (H) 6.5 - 8.1 g/dL   Albumin 4.2 3.5 - 5.0 g/dL   AST 19 15 - 41 U/L   ALT 20 0 - 44 U/L   Alkaline Phosphatase 57 38 - 126 U/L   Total Bilirubin 0.5 0.3 - 1.2 mg/dL   GFR, Estimated >98 >11 mL/min    Comment: (NOTE) Calculated using the CKD-EPI Creatinine Equation (2021)    Anion gap 8 5 - 15    Comment: Performed at [facility], [address] [place], [place] [address]  Lipase, blood     Status: None   Collection Time: 03/16/23  4:26 PM  Result Value Ref Range   Lipase 29 11 - 51 U/L    Comment: Performed at [facility], [address] [place], [place] [address]  Urinalysis, Routine w reflex microscopic -Urine, Clean Catch     Status: Abnormal   Collection Time: 03/16/23  4:26 PM  Result Value Ref Range   Color, Urine YELLOW YELLOW   APPearance CLEAR CLEAR   Specific Gravity, Urine 1.010 1.005 - 1.030   pH 6.0 5.0 - 8.0   Glucose, UA NEGATIVE NEGATIVE mg/dL   Hgb urine dipstick TRACE (A) NEGATIVE   Bilirubin Urine NEGATIVE NEGATIVE   Ketones, ur 15 (A) NEGATIVE mg/dL   Protein, ur NEGATIVE NEGATIVE mg/dL   Nitrite NEGATIVE NEGATIVE   Leukocytes,Ua NEGATIVE NEGATIVE    Comment: Performed at [facility], [address] [place], [place] [address]  hCG, serum, qualitative     Status: None   Collection Time: 03/16/23  4:26 PM  Result Value Ref Range   Preg, Serum NEGATIVE NEGATIVE    Comment:        THE SENSITIVITY OF THIS METHODOLOGY  IS >10 mIU/mL. Performed at [facility], [address] [place], [place] [address]   Pregnancy, urine     Status: None   Collection Time: 03/16/23  4:26 PM  Result Value Ref Range   Preg Test, Ur NEGATIVE NEGATIVE    Comment:        THE SENSITIVITY OF THIS METHODOLOGY IS >25 mIU/mL. Performed at [facility], [address] [place], [place] [address]   Urinalysis, Microscopic (reflex)     Status: Abnormal   Collection Time: 03/16/23  4:26 PM  Result Value Ref Range   RBC / HPF 0-5 0 - 5 RBC/hpf   WBC, UA 0-5 0 - 5 WBC/hpf   Bacteria, UA RARE (A) NONE SEEN   Squamous Epithelial / HPF 0-5 0 -  5 /HPF    Comment: Performed at [facility], [address] [place], [place] [address]    CT ABDOMEN PELVIS W CONTRAST  Result Date: 03/16/2023 CLINICAL DATA:  Acute abdominal pain and right upper quadrant pain. EXAM: CT ABDOMEN AND PELVIS WITH CONTRAST TECHNIQUE: Multidetector CT imaging of the abdomen and pelvis was performed using the standard protocol following bolus administration of intravenous contrast. RADIATION DOSE REDUCTION: This exam was performed according to the departmental dose-optimization program which includes automated exposure control, adjustment of the mA and/or kV according to patient size and/or use of iterative reconstruction technique. CONTRAST:  OMNIPAQUE IOHEXOL 300 MG/ML  SOLN COMPARISON:  None Available. FINDINGS: Lower chest: No acute abnormality. Hepatobiliary: There is diffuse gallbladder wall thickening. Gallbladder is distended and gallstones are present. There is no biliary ductal dilatation. The liver is within normal limits. Pancreas: Unremarkable. No pancreatic ductal dilatation or surrounding inflammatory changes. Spleen: Normal in size without focal abnormality. Adrenals/Urinary Tract: Adrenal glands are unremarkable. Kidneys are normal, without renal calculi, focal lesion, or hydronephrosis. Bladder is unremarkable.  Stomach/Bowel: Stomach is within normal limits. Appendix appears normal. No evidence of bowel wall thickening, distention, or inflammatory changes. Vascular/Lymphatic: No significant vascular findings are present. No enlarged abdominal or pelvic lymph nodes. Reproductive: Uterus and bilateral adnexa are unremarkable. Other: There is trace free fluid in the pelvis. Musculoskeletal: No acute or significant osseous findings. IMPRESSION: 1. Findings worrisome for acute cholecystitis please correlate clinically. 2. Trace free fluid in the pelvis. Electronically Signed   By: Darliss Cheney M.D.   On: 03/16/2023 19:07    I have personally reviewed the relevant scans and laboratory data   A/P: Cholecystitis with cholelithiasis  I discussed the diagnosis with the patient in detail.  I laparoscopically cystectomy and possible cholangiogram is recommended. I discussed the procedure in detail.   We discussed the risks and benefits of a laparoscopic cholecystectomy and possible cholangiogram including, but not limited to bleeding, infection, injury to surrounding structures such as the intestine or liver, bile leak, retained gallstones, need to convert to an open procedure, prolonged diarrhea, blood clots such as  DVT, common bile duct injury, anesthesia risks, and possible need for additional procedures.  The likelihood of improvement in symptoms and return to the patient's normal status is good. We discussed the typical post-operative recovery course.  She understands and agrees to proceed with surgery which is scheduled for today   Abigail Miyamoto, MD [facility], A [facility]

## 2023-03-17 NOTE — Op Note (Signed)
LAPAROSCOPIC CHOLECYSTECTOMY  Procedure Note  Stacy Romero 03/17/2023   Pre-op Diagnosis: Acute cholecystitis with cholelithiasis     Post-op Diagnosis: Pain  Procedure(s): LAPAROSCOPIC CHOLECYSTECTOMY  Surgeon(s): Abigail Miyamoto, MD  Anesthesia: General  Staff:  Circulator: Dominga Ferry, RN Scrub Person: Tilden Fossa; Claudine Mouton L  Estimated Blood Loss: Minimal               Specimens: Sent to pathology  Findings: The patient was found to have an acutely inflamed, distended gallbladder with gallstones.  Procedure: The patient was brought to the operating identifies correct patient.  She is placed upon the operating table and general anesthesia was induced.  Her abdomen was then prepped and draped in usual sterile fashion.  I made a vertical incision below the umbilicus with a scalpel.  I carried this down to the fascia which was then opened the scalpel.  Hemostat was then used to pass to the peritoneal cavity under direct vision.  A 0 Vicryl pursing suture was placed around the fascial opening.  The [facility] port was placed at the opening and insufflation of the abdomen was begun.  I placed a 5 mm trocar in the patient's epigastrium and 2 more in the right upper quadrant all under direct vision.  The gallbladder was found to be acutely distended and inflamed.  There was edema in the wall.  I had to aspirate bile from the gallbladder under direct vision in order to facilitate grasping the gallbladder.  I was then able to identify an elevated.  I then dissected out the base of the gallbladder.  It was quite inflamed but I was able to dissect out the cystic duct and achieved a degree around it.  I then clipped 3 times proximally once distally and transected it.  The cystic artery was then identified and clipped proximally distally and transected as well.  I then slowly dissected free the gallbladder from the liver bed.  Again it was quite inflamed and friable.  Once I  have the gallbladder completely off the liver bed I placed an Endosac along with a very large stone to come out of the gallbladder and removed the gallbladder and gallstones from the abdomen.  I then copiously irrigated the abdomen normal saline.  Hemostasis appeared to be achieved in the patient's liver bed with the cautery and a piece of surgical snow.  I irrigated the abdomen further and hemostasis peer to be achieved.  I removed the trocar at the umbilicus and tied the 0 Vicryl in place closing the fascial defect.  I irrigated the abdomen more and hemostased again appeared to be achieved.  All which were then removed under direct vision and the abdomen was deflated.  All incisions were then anesthetized with Marcaine and closed with 4-0 Monocryl sutures.  Dermabond was then applied.  The patient tolerated the procedure well.  All the counts were correct at the end of the procedure.  The patient was then extubated in the operating room and taken in stable condition to the recovery room.          Abigail Miyamoto   Date: 03/17/2023  Time: 12:09 PM

## 2023-03-17 NOTE — Transfer of Care (Signed)
Immediate Anesthesia Transfer of Care Note  Patient: Stacy Romero  Procedure(s) Performed: LAPAROSCOPIC CHOLECYSTECTOMY  Patient Location: PACU  Anesthesia Type:General  Level of Consciousness: awake and patient cooperative  Airway & Oxygen Therapy: Patient Spontanous Breathing and Patient connected to face mask  Post-op Assessment: Report given to RN and Post -op Vital signs reviewed and stable  Post vital signs: Reviewed and stable  Last Vitals:  Vitals Value Taken Time  BP 134/94 03/17/23 1222  Temp 36.5 C 03/17/23 1222  Pulse 80 03/17/23 1227  Resp 9 03/17/23 1227  SpO2 93 % 03/17/23 1227  Vitals shown include unfiled device data.  Last Pain:  Vitals:   03/17/23 1000  TempSrc:   PainSc: 6       Patients Stated Pain Goal: 3 (03/17/23 1000)  Complications: No notable events documented.

## 2023-03-17 NOTE — TOC Progression Note (Signed)
Transition of Care [facility]) - Progression Note    Patient Details  Name: Stacy Romero MRN: [id number] Date of Birth: 06-20-1981  Transition of [facility]) CM/SW Contact  Adrian Prows, RN Phone Number: 03/17/2023, 1:20 PM  Clinical Narrative:    Pt in procedural area; unable to complete TOC assessment.        Expected Discharge Plan and Services                                               Social Determinants of Health (SDOH) Interventions SDOH Screenings   Food Insecurity: No Food Insecurity (03/16/2023)  Housing: Low Risk  (03/16/2023)  Transportation Needs: No Transportation Needs (03/16/2023)  Utilities: Not At Risk (03/16/2023)  Tobacco Use: Low Risk  (03/16/2023)    Readmission Risk Interventions     No data to display

## 2023-03-17 NOTE — Discharge Instructions (Signed)

## 2023-03-17 NOTE — Anesthesia Procedure Notes (Signed)
Procedure Name: Intubation Date/Time: 03/17/2023 11:09 AM  Performed by: Vanessa [place], CRNAPre-anesthesia Checklist: Patient identified, Emergency Drugs available, Suction available and Patient being monitored Patient Re-evaluated:Patient Re-evaluated prior to induction Oxygen Delivery Method: Circle system utilized Preoxygenation: Pre-oxygenation with 100% oxygen Induction Type: IV induction Ventilation: Mask ventilation without difficulty Laryngoscope Size: 2 and Miller Grade View: Grade I Tube type: Oral Tube size: 7.0 mm Number of attempts: 1 Airway Equipment and Method: Stylet Placement Confirmation: ETT inserted through vocal cords under direct vision, positive ETCO2 and breath sounds checked- equal and bilateral Secured at: 21 cm Tube secured with: Tape Dental Injury: Teeth and Oropharynx as per pre-operative assessment

## 2023-03-17 NOTE — Anesthesia Preprocedure Evaluation (Addendum)
Anesthesia Evaluation  Patient identified by MRN, date of birth, ID band Patient awake    Reviewed: Allergy & Precautions, NPO status , Patient's Chart, lab work & pertinent test results  History of Anesthesia Complications Negative for: history of anesthetic complications  Airway Mallampati: III  TM Distance: >3 FB Neck ROM: Full    Dental  (+) Dental Advisory Given   Pulmonary neg pulmonary ROS   Pulmonary exam normal breath sounds clear to auscultation       Cardiovascular hypertension, (-) angina (-) Past MI, (-) Cardiac Stents and (-) CABG (-) dysrhythmias  Rhythm:Regular Rate:Normal     Neuro/Psych  Headaches, neg Seizures PSYCHIATRIC DISORDERS Anxiety        GI/Hepatic Neg liver ROS,GERD  ,,cholecystitis   Endo/Other  negative endocrine ROS    Renal/GU negative Renal ROS     Musculoskeletal   Abdominal   Peds  Hematology negative hematology ROS (+) Lab Results      Component                Value               Date                      WBC                      8.6                 03/16/2023                HGB                      13.2                03/16/2023                HCT                      39.2                03/16/2023                MCV                      89.9                03/16/2023                PLT                      322                 03/16/2023              Anesthesia Other Findings Nausea  Reproductive/Obstetrics                             Anesthesia Physical Anesthesia Plan  ASA: 2  Anesthesia Plan: General   Post-op Pain Management:    Induction: Intravenous and Rapid sequence  PONV Risk Score and Plan: 3 and Ondansetron, Dexamethasone and Treatment may vary due to age or medical condition  Airway Management Planned: Oral ETT  Additional Equipment:   Intra-op Plan:   Post-operative Plan: Extubation in OR  Informed Consent: I have  reviewed the patients History and Physical,  chart, labs and discussed the procedure including the risks, benefits and alternatives for the proposed anesthesia with the patient or authorized representative who has indicated his/her understanding and acceptance.     Dental advisory given  Plan Discussed with: CRNA and Anesthesiologist  Anesthesia Plan Comments: (Risks of general anesthesia discussed including, but not limited to, sore throat, hoarse voice, chipped/damaged teeth, injury to vocal cords, nausea and vomiting, allergic reactions, lung infection, heart attack, stroke, and death. All questions answered. )        Anesthesia Quick Evaluation

## 2023-03-17 NOTE — Discharge Summary (Signed)
[facility] Discharge Summary   Patient ID: Stacy Romero MRN: [id number] DOB/AGE: 1981/02/22 42 y.o.  Admit date: 03/16/2023 Discharge date: 03/17/2023  Admitting Diagnosis: Acute cholecystitis   Discharge Diagnosis S/P laparoscopic cholecystectomy   Consultants None   Imaging: CT ABDOMEN PELVIS W CONTRAST  Result Date: 03/16/2023 CLINICAL DATA:  Acute abdominal pain and right upper quadrant pain. EXAM: CT ABDOMEN AND PELVIS WITH CONTRAST TECHNIQUE: Multidetector CT imaging of the abdomen and pelvis was performed using the standard protocol following bolus administration of intravenous contrast. RADIATION DOSE REDUCTION: This exam was performed according to the departmental dose-optimization program which includes automated exposure control, adjustment of the mA and/or kV according to patient size and/or use of iterative reconstruction technique. CONTRAST:  OMNIPAQUE IOHEXOL 300 MG/ML  SOLN COMPARISON:  None Available. FINDINGS: Lower chest: No acute abnormality. Hepatobiliary: There is diffuse gallbladder wall thickening. Gallbladder is distended and gallstones are present. There is no biliary ductal dilatation. The liver is within normal limits. Pancreas: Unremarkable. No pancreatic ductal dilatation or surrounding inflammatory changes. Spleen: Normal in size without focal abnormality. Adrenals/Urinary Tract: Adrenal glands are unremarkable. Kidneys are normal, without renal calculi, focal lesion, or hydronephrosis. Bladder is unremarkable. Stomach/Bowel: Stomach is within normal limits. Appendix appears normal. No evidence of bowel wall thickening, distention, or inflammatory changes. Vascular/Lymphatic: No significant vascular findings are present. No enlarged abdominal or pelvic lymph nodes. Reproductive: Uterus and bilateral adnexa are unremarkable. Other: There is trace free fluid in the pelvis. Musculoskeletal: No acute or significant osseous findings.  IMPRESSION: 1. Findings worrisome for acute cholecystitis please correlate clinically. 2. Trace free fluid in the pelvis. Electronically Signed   By: Darliss Cheney M.D.   On: 03/16/2023 19:07    Procedures Dr. Abigail Miyamoto (03/17/23) - Laparoscopic Cholecystectomy   Hospital Course:  Patient is a 42 year old female who presented to [facility] with abdominal pain.  Workup showed cholecystitis.  Patient was admitted and underwent procedure listed above.  Tolerated procedure well and was transferred to the floor.  Diet was advanced as tolerated.  On POD0, the patient was voiding well, tolerating diet, ambulating well, pain well controlled, vital signs stable, incisions c/d/i and felt stable for discharge home.  Patient will follow up in our office in 3 weeks and knows to call with questions or concerns.  She will call to confirm appointment date/time.    Physical Exam: General:  Alert, NAD, pleasant, comfortable Abd:  Soft, ND, mild tenderness, incisions C/D/I  I or a member of my team have reviewed this patient in the Controlled Substance Database.   Allergies as of 03/17/2023   No Known Allergies      Medication List     TAKE these medications    acetaminophen 500 MG tablet Commonly known as: TYLENOL Take 2 tablets (1,000 mg total) by mouth every 6 (six) hours as needed for mild pain or fever.   amoxicillin-clavulanate 875-125 MG tablet Commonly known as: AUGMENTIN Take 1 tablet by mouth 2 (two) times daily for 5 days.   medroxyPROGESTERone 150 MG/ML injection Commonly known as: DEPO-PROVERA Inject 1 mL (150 mg total) into the muscle every 3 (three) months.   ondansetron 4 MG tablet Commonly known as: ZOFRAN Take 4 mg by mouth 3 (three) times daily as needed for nausea or vomiting.   oxyCODONE 5 MG immediate release tablet Commonly known as: Oxy IR/ROXICODONE Take 1 tablet (5 mg total) by mouth every 6 (six) hours as needed for moderate pain or severe  pain.   PRENATAL VITAMIN  PO Take by mouth.   VITAMIN D PO Take by mouth.          Follow-up Information     Maczis, Hedda Slade, [place]. Schedule an appointment as soon as possible for a visit in 3 week(s).   Specialty: General Surgery Why: Our office is working on scheduling a post-op appointment in about 3 weeks, please call to confirm appointment date/time. Please arrive 30 min prior to appointment time and have ID and insurance info with you. Contact information: [address] [place] [address] [facility] [place] [address] [phone]                 Signed: Juliet Rude , [facility] 03/17/2023, 3:22 PM Please see Amion for pager number during day hours 7:00am-4:30pm

## 2023-03-18 ENCOUNTER — Encounter (HOSPITAL_COMMUNITY): Payer: Self-pay | Admitting: Surgery

## 2023-03-18 LAB — SURGICAL PATHOLOGY
